# Patient Record
Sex: Male | Born: 1965 | Race: White | Hispanic: No | Smoking: Former smoker
Health system: Southern US, Community
[De-identification: ages and names within clinical notes are randomized; demographics above are authoritative.]

## PROBLEM LIST (undated history)

## (undated) DIAGNOSIS — F329 Major depressive disorder, single episode, unspecified: Secondary | ICD-10-CM

## (undated) DIAGNOSIS — J189 Pneumonia, unspecified organism: Secondary | ICD-10-CM

## (undated) DIAGNOSIS — F32A Depression, unspecified: Secondary | ICD-10-CM

## (undated) DIAGNOSIS — B2799 Infectious mononucleosis, unspecified with other complication: Secondary | ICD-10-CM

## (undated) DIAGNOSIS — K759 Inflammatory liver disease, unspecified: Secondary | ICD-10-CM

## (undated) DIAGNOSIS — B178 Other specified acute viral hepatitis: Secondary | ICD-10-CM

## (undated) DIAGNOSIS — M109 Gout, unspecified: Secondary | ICD-10-CM

## (undated) HISTORY — PX: TONSILLECTOMY: SUR1361

## (undated) HISTORY — DX: Depression, unspecified: F32.A

## (undated) HISTORY — DX: Major depressive disorder, single episode, unspecified: F32.9

---

## 2005-06-21 ENCOUNTER — Emergency Department (HOSPITAL_COMMUNITY): Admission: EM | Admit: 2005-06-21 | Discharge: 2005-06-21 | Payer: Self-pay | Admitting: Family Medicine

## 2009-07-23 ENCOUNTER — Encounter: Admission: RE | Admit: 2009-07-23 | Discharge: 2009-07-23 | Payer: Self-pay | Admitting: Gastroenterology

## 2010-07-23 ENCOUNTER — Emergency Department (HOSPITAL_COMMUNITY)
Admission: EM | Admit: 2010-07-23 | Discharge: 2010-07-23 | Disposition: A | Discharge status: Home / Self Care | Payer: Commercial Managed Care - PPO | Attending: Emergency Medicine | Admitting: Emergency Medicine

## 2010-07-23 ENCOUNTER — Emergency Department (HOSPITAL_COMMUNITY): Payer: Commercial Managed Care - PPO

## 2010-07-23 DIAGNOSIS — R0989 Other specified symptoms and signs involving the circulatory and respiratory systems: Secondary | ICD-10-CM | POA: Insufficient documentation

## 2010-07-23 DIAGNOSIS — R0602 Shortness of breath: Secondary | ICD-10-CM | POA: Insufficient documentation

## 2010-07-23 DIAGNOSIS — R079 Chest pain, unspecified: Secondary | ICD-10-CM | POA: Insufficient documentation

## 2010-07-23 DIAGNOSIS — IMO0002 Reserved for concepts with insufficient information to code with codable children: Secondary | ICD-10-CM | POA: Insufficient documentation

## 2010-07-23 DIAGNOSIS — F411 Generalized anxiety disorder: Secondary | ICD-10-CM | POA: Insufficient documentation

## 2010-07-23 DIAGNOSIS — Y9383 Activity, rough housing and horseplay: Secondary | ICD-10-CM | POA: Insufficient documentation

## 2010-07-23 DIAGNOSIS — R0609 Other forms of dyspnea: Secondary | ICD-10-CM | POA: Insufficient documentation

## 2010-07-23 LAB — DIFFERENTIAL
Basophils Relative: 1 % (ref 0–1)
Eosinophils Relative: 1 % (ref 0–5)
Lymphocytes Relative: 21 % (ref 12–46)
Lymphs Abs: 2.6 10*3/uL (ref 0.7–4.0)
Monocytes Absolute: 0.8 10*3/uL (ref 0.1–1.0)
Neutro Abs: 8.6 10*3/uL — ABNORMAL HIGH (ref 1.7–7.7)
Neutrophils Relative %: 71 % (ref 43–77)

## 2010-07-23 LAB — BASIC METABOLIC PANEL
Creatinine, Ser: 1 mg/dL (ref 0.4–1.5)
GFR calc non Af Amer: >60 mL/min (ref >60)
Potassium: 3.9 mEq/L (ref 3.5–5.1)
Sodium: 138 mEq/L (ref 135–145)

## 2010-07-23 LAB — CBC
Hemoglobin: 16.7 g/dL (ref 13.0–17.0)
MCH: 31.7 pg (ref 26.0–34.0)
MCHC: 35.5 g/dL (ref 30.0–36.0)
MCV: 89.2 fL (ref 78.0–100.0)
RBC: 5.27 MIL/uL (ref 4.22–5.81)

## 2010-07-23 LAB — CK TOTAL AND CKMB (NOT AT ARMC)
CK, MB: 2.1 ng/mL (ref 0.3–4.0)
Relative Index: 1.7 (ref 0.0–2.5)
Total CK: 121 U/L (ref 7–232)

## 2012-05-30 ENCOUNTER — Ambulatory Visit: Payer: Self-pay | Admitting: Emergency Medicine

## 2012-05-30 VITALS — BP 126/78 | HR 108 | Temp 98.8°F | Resp 16 | Ht 66.5 in | Wt 208.0 lb

## 2012-05-30 DIAGNOSIS — J209 Acute bronchitis, unspecified: Secondary | ICD-10-CM

## 2012-05-30 DIAGNOSIS — J309 Allergic rhinitis, unspecified: Secondary | ICD-10-CM

## 2012-05-30 MED ORDER — HYDROCOD POLST-CHLORPHEN POLST 10-8 MG/5ML PO LQCR: 5.0000 mL | Freq: Two times a day (BID) | ORAL | Status: DC | PRN

## 2012-05-30 MED ORDER — ONDANSETRON 8 MG PO TBDP: 8.0000 mg | ORAL_TABLET | Freq: Three times a day (TID) | ORAL | Status: DC | PRN

## 2012-05-30 MED ORDER — AZITHROMYCIN 250 MG PO TABS: ORAL_TABLET | ORAL | Status: DC

## 2012-05-30 MED ORDER — PSEUDOEPHEDRINE-GUAIFENESIN ER 60-600 MG PO TB12: 1.0000 | ORAL_TABLET | Freq: Two times a day (BID) | ORAL | Status: DC

## 2012-05-30 NOTE — Patient Instructions (Addendum)
Allergic Rhinitis  Allergic rhinitis is when the mucous membranes in the nose respond to allergens. Allergens are particles in the air that cause your body to have an allergic reaction. This causes you to release allergic antibodies. Through a chain of events, these eventually cause you to release histamine into the blood stream (hence the use of antihistamines). Although meant to be protective to the body, it is this release that causes your discomfort, such as frequent sneezing, congestion and an itchy runny nose.    CAUSES    The pollen allergens may come from grasses, trees, and weeds. This is seasonal allergic rhinitis, or "hay fever." Other allergens cause year-round allergic rhinitis (perennial allergic rhinitis) such as house dust mite allergen, pet dander and mold spores.    SYMPTOMS     Nasal stuffiness (congestion).   Runny, itchy nose with sneezing and tearing of the eyes.   There is often an itching of the mouth, eyes and ears.  It cannot be cured, but it can be controlled with medications.  DIAGNOSIS    If you are unable to determine the offending allergen, skin or blood testing may find it.  TREATMENT     Avoid the allergen.   Medications and allergy shots (immunotherapy) can help.   Hay fever may often be treated with antihistamines in pill or nasal spray forms. Antihistamines block the effects of histamine. There are over-the-counter medicines that may help with nasal congestion and swelling around the eyes. Check with your caregiver before taking or giving this medicine.  If the treatment above does not work, there are many new medications your caregiver can prescribe. Stronger medications may be used if initial measures are ineffective. Desensitizing injections can be used if medications and avoidance fails. Desensitization is when a patient is given ongoing shots until the body becomes less sensitive to the allergen. Make sure you follow up with your caregiver if problems continue.   SEEK MEDICAL CARE IF:     You develop fever (more than 100.5 F (38.1 C).   You develop a cough that does not stop easily (persistent).   You have shortness of breath.   You start wheezing.   Symptoms interfere with normal daily activities.  Document Released: 10/21/2000 Document Revised: 04/20/2011 Document Reviewed: 05/02/2008  Exit[employer] Patient Information 2013 [employer].

## 2012-05-30 NOTE — Progress Notes (Signed)
[facility] [address] [place] [address] [phone]  Date:  05/30/2012   Name:  Jesse Warner   DOB:  04-12-1965   MRN:  [id number]  PCP:  No primary provider on file.    Chief Complaint: Cough and Nasal Congestion   History of Present Illness:  Jesse Warner is a 47 y.o. very pleasant male patient who presents with the following:  Patient has no usual history of allergies.  Now has nasal congestion, discharge that is largely mucoid.  No fever or chills.  Has a cough productive of AM scant purulent sputum.  Denies wheezing or shortness of breath. Fatigued and some malaise  No stool change or rash.  Over past few days has post tussive nausea and one episode of vomiting.  No underlying nausea.  No improvement with over the counter medications or other home remedies. Denies other complaint or health concern today.   There is no problem list on file for this patient.   No past medical history on file.  No past surgical history on file.  History  Substance Use Topics  . 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 Smokeless tobacco: Not on file  . Alcohol Use: Yes    No family history on file.  No Known Allergies  Medication list has been reviewed and updated.  No current outpatient prescriptions on file prior to visit.   No current facility-administered medications on file prior to visit.    Review of Systems:  As per HPI, otherwise negative.    Physical Examination: Filed Vitals:   05/30/12 1431  BP: 126/78  Pulse: 108  Temp: 98.8 F (37.1 C)  Resp: 16   Filed Vitals:   05/30/12 1431  Height: 5' 6.5" (1.689 m)  Weight: 208 lb (94.348 kg)   Body mass index is 33.07 kg/(m^2). Ideal Body Weight: Weight in (lb) to have BMI = 25: 156.9  GEN: WDWN, NAD, Non-toxic, A & O x 3 HEENT: obese, Normocephalic. Neck supple. No masses, No LAD. Ears and Nose: No external deformity. CV: RRR, No M/G/R. No JVD. No thrill. No extra heart sounds. PULM: CTA  B, no wheezes, crackles, rhonchi. No retractions. No resp. distress. No accessory muscle use. ABD: S, NT, ND, +BS. No rebound. No HSM. EXTR: No c/c/e NEURO Normal gait.  PSYCH: Normally interactive. Conversant. Not depressed or anxious appearing.  Calm demeanor.    Assessment and Plan: Seasonal allergic rhinitis Bronchitis zpak mucinex d tussionex zofran   Signed,  Phillips Odor, MD

## 2012-05-30 NOTE — Addendum Note (Signed)
Addended by: Carmelina Dane on: 05/30/2012 03:27 PM   Modules accepted: Orders

## 2012-06-05 ENCOUNTER — Encounter (HOSPITAL_COMMUNITY): Payer: Self-pay | Admitting: *Deleted

## 2012-06-05 ENCOUNTER — Emergency Department (HOSPITAL_COMMUNITY)
Admission: EM | Admit: 2012-06-05 | Discharge: 2012-06-05 | Disposition: A | Discharge status: Home / Self Care | Payer: Self-pay | Source: Home / Self Care | Attending: Emergency Medicine | Admitting: Emergency Medicine

## 2012-06-05 ENCOUNTER — Emergency Department (INDEPENDENT_AMBULATORY_CARE_PROVIDER_SITE_OTHER): Payer: Commercial Managed Care - PPO

## 2012-06-05 ENCOUNTER — Emergency Department (INDEPENDENT_AMBULATORY_CARE_PROVIDER_SITE_OTHER): Payer: Self-pay

## 2012-06-05 DIAGNOSIS — M109 Gout, unspecified: Secondary | ICD-10-CM

## 2012-06-05 DIAGNOSIS — J189 Pneumonia, unspecified organism: Secondary | ICD-10-CM

## 2012-06-05 LAB — URIC ACID: Uric Acid, Serum: 8 mg/dL — ABNORMAL HIGH (ref 4.0–7.8)

## 2012-06-05 MED ORDER — IBUPROFEN 800 MG PO TABS
ORAL_TABLET | ORAL | Status: AC
  Filled 2012-06-05: qty 1

## 2012-06-05 MED ORDER — LEVOFLOXACIN 500 MG PO TABS: 500.0000 mg | ORAL_TABLET | Freq: Every day | ORAL | Status: DC

## 2012-06-05 MED ORDER — LIDOCAINE HCL (PF) 1 % IJ SOLN
INTRAMUSCULAR | Status: AC
  Filled 2012-06-05: qty 5

## 2012-06-05 MED ORDER — CEFTRIAXONE SODIUM 1 G IJ SOLR
1.0000 g | Freq: Once | INTRAMUSCULAR | Status: AC
  Administered 2012-06-05: 1 g via INTRAMUSCULAR

## 2012-06-05 MED ORDER — CEFTRIAXONE SODIUM 1 G IJ SOLR
INTRAMUSCULAR | Status: AC
  Filled 2012-06-05: qty 10

## 2012-06-05 MED ORDER — COLCHICINE 0.6 MG PO TABS: ORAL_TABLET | ORAL | Status: DC

## 2012-06-05 MED ORDER — HYDROCOD POLST-CHLORPHEN POLST 10-8 MG/5ML PO LQCR: 5.0000 mL | Freq: Two times a day (BID) | ORAL | Status: DC | PRN

## 2012-06-05 MED ORDER — AMOXICILLIN-POT CLAVULANATE 875-125 MG PO TABS: 1.0000 | ORAL_TABLET | Freq: Two times a day (BID) | ORAL | Status: DC

## 2012-06-05 MED ORDER — IBUPROFEN 800 MG PO TABS
800.0000 mg | ORAL_TABLET | Freq: Once | ORAL | Status: AC
  Administered 2012-06-05: 800 mg via ORAL

## 2012-06-05 NOTE — ED Provider Notes (Signed)
Chief Complaint:   Chief Complaint  Patient presents with  . Ankle Pain    History of Present Illness:   Jesse Warner is a 47 year old male who comes in today with right ankle pain and cough.  1. Right ankle pain: This is been going on for 5 days. There is pain, swelling, erythema, and heat. It hurts to touch her to move. He denies any injury to the area. He's had no fever or chills. No prior history of arthritis, gout, or any other joint pains.  2. Cough: For the past 2 weeks she's had a dry cough and wheezing. He doesn't have much energy. He had some fever at the onset of the symptoms but none recently. He denies any sore throat but does have some postnasal drip. He saw another physician at [facility] urgent care this past Monday, week ago was given a Z-Pak, cough medicine, and nausea medication, and allergy medication. He does not feel any better. He denies any chest pain or shortness of breath.  Review of Systems:  Other than noted above, the patient denies any of the following symptoms: Systemic:  No fevers, chills, sweats, weight loss or gain, fatigue, or tiredness. Eye:  No redness or discharge. ENT:  No ear pain, drainage, headache, nasal congestion, drainage, sinus pressure, difficulty swallowing, or sore throat. Neck:  No neck pain or swollen glands. Lungs:  No cough, sputum production, hemoptysis, wheezing, chest tightness, shortness of breath or chest pain. GI:  No abdominal pain, nausea, vomiting or diarrhea.  PMFSH:  Past medical history, family history, social history, meds, and allergies were reviewed.  Physical Exam:   Vital signs:  BP 127/80  Pulse 97  Temp(Src) 97.4 F (36.3 C) (Oral)  Resp 18  SpO2 97% General:  Alert and oriented.  In no distress.  Skin warm and dry. Eye:  No conjunctival injection or drainage. Lids were normal. ENT:  TMs and canals were normal, without erythema or inflammation.  Nasal mucosa was clear and uncongested, without drainage.  Mucous  membranes were moist.  Pharynx was clear with no exudate or drainage.  There were no oral ulcerations or lesions. Neck:  Supple, no adenopathy, tenderness or mass. Lungs:  No respiratory distress.  Lungs were clear to auscultation, without wheezes, rales or rhonchi.  Breath sounds were clear and equal bilaterally.  Heart:  Regular rhythm, without gallops, murmers or rubs. Extremities: The right ankle was diffusely erythematous, swollen, tender, and hot to touch. There was pain with movement. The erythema extends onto the dorsum of the foot. Joint survey was otherwise unremarkable, pulses were full, sensation was intact, and capillary refill was normal. Skin:  Clear, warm, and dry, without rash or lesions.  Labs:   Results for orders placed during the hospital encounter of 06/05/12  URIC ACID      Result Value Range   Uric Acid, Serum 8.0 (*) 4.0 - 7.8 mg/dL     Radiology:  Dg Chest 2 View  06/05/2012  *RADIOLOGY REPORT*  Clinical Data: Cough for the past 2 weeks.  CHEST - 2 VIEW  Comparison: Chest x-ray 07/23/2010.  Findings: New area of airspace consolidation in the right upper lobe, concerning for pneumonia.  The lungs are otherwise clear.  No definite pleural effusions.  Pulmonary vasculature is normal.  Mild right hilar fullness.  Mediastinal contours are otherwise unremarkable.  IMPRESSION: 1.  New area of airspace consolidation in the right upper lobe highly concerning for pneumonia.  There is slight right hilar prominence,  which likely reflects some reactive hilar lymphadenopathy.  Follow-up chest radiographs in 2-3 weeks after appropriate trial of antimicrobial therapy are recommended to ensure complete resolution of these findings (i.e., to exclude the presence of a central obstructing lesion).   Original Report Authenticated By: Trudie Reed, M.D.    Dg Ankle Complete Right  06/05/2012  *RADIOLOGY REPORT*  Clinical Data: 5-day history of right-sided ankle pain.  RIGHT ANKLE - COMPLETE  3+ VIEW  Comparison: No priors.  Findings: Three views of the right ankle demonstrate no acute displaced fracture, subluxation, dislocation, joint or soft tissue abnormality.  IMPRESSION: 1.  No acute radiographic abnormality of the right ankle.   Original Report Authenticated By: Trudie Reed, M.D.     Course in Urgent Care Center:   For the pneumonia he was given Rocephin 1 g IM.  Assessment:  The primary encounter diagnosis was Gout. A diagnosis of Community acquired pneumonia was also pertinent to this visit.  He appears to have both a right midlung infiltrate compatible with community-acquired pneumonia and acute gout. For the gout I am going to treat with colchicine. I suggest he followup with a primary care physician in about 2 weeks with respect to the elevated uric acid. He will probably need a uric acid lowering drugs. For the pneumonia, he's to followup here in 24 hours, and was begun on the meds as listed below. I'm going to use antibiotics, since it does seem to have been resistant to Z-Pak and may be a relatively resistant bacteria. I impressed upon him the importance of following up in 2-3 weeks with his primary care physician with a repeat chest x-ray to make sure this is completely cleared up.  Plan:   1.  The following meds were prescribed:   Discharge Medication List as of 06/05/2012  4:49 PM    START taking these medications   Details  amoxicillin-clavulanate (AUGMENTIN) 875-125 MG per tablet Take 1 tablet by mouth 2 (two) times daily., Starting 06/05/2012, Until Discontinued, Normal    !! chlorpheniramine-HYDROcodone (TUSSIONEX) 10-8 MG/5ML LQCR Take 5 mLs by mouth every 12 (twelve) hours as needed., Starting 06/05/2012, Until Discontinued, Normal    colchicine 0.6 MG tablet Take 2 now and 1 in 1 hour.  May repeat dose once daily.  For gout attack., Normal    levofloxacin (LEVAQUIN) 500 MG tablet Take 1 tablet (500 mg total) by mouth daily., Starting 06/05/2012, Until  Discontinued, Normal     !! - Potential duplicate medications found. Please discuss with provider.     2.  The patient was instructed in symptomatic care and handouts were given. 3.  The patient was told to return if becoming worse in any way, for a scheduled followup in 48 hours, and given some red flag symptoms such as fever, chest pain, or difficulty breathing that would indicate earlier return.      Reuben Likes, MD 06/05/12 [phone]

## 2012-06-05 NOTE — ED Notes (Signed)
Patient complains of right ankle pain that started Wednesday. States does know what happened; states did not injure his ankle at all.

## 2012-06-07 ENCOUNTER — Encounter (HOSPITAL_COMMUNITY): Payer: Self-pay | Admitting: Emergency Medicine

## 2012-06-07 ENCOUNTER — Emergency Department (INDEPENDENT_AMBULATORY_CARE_PROVIDER_SITE_OTHER)
Admission: EM | Admit: 2012-06-07 | Discharge: 2012-06-07 | Disposition: A | Discharge status: Home / Self Care | Payer: Self-pay | Source: Home / Self Care | Attending: Family Medicine | Admitting: Family Medicine

## 2012-06-07 DIAGNOSIS — J189 Pneumonia, unspecified organism: Secondary | ICD-10-CM

## 2012-06-07 NOTE — ED Notes (Signed)
Pt presents for follow up on pneumonia from 2 days ago. No new problems. Cough seems better. Patient is alert and oriented.

## 2012-06-07 NOTE — ED Provider Notes (Signed)
History     CSN: [id number]  Arrival date & time 06/07/12  1338   First MD Initiated Contact with Patient 06/07/12 1501      Chief Complaint  Patient presents with  . Follow-up    (Consider location/radiation/quality/duration/timing/severity/associated sxs/prior treatment) Patient is a 47 y.o. male presenting with cough. The history is provided by the patient. No language interpreter was used.  Cough Cough characteristics:  Productive Sputum characteristics:  Nondescript Severity:  Moderate  Pt here for recheck of Pneumonia.   Pt was seen here Thursday and is herre today for a 2 day recheck.  Pt is feeling better,  Decreased temp,  Pt reports still has bad cough History reviewed. No pertinent past medical history.  History reviewed. No pertinent past surgical history.  History reviewed. No pertinent family history.  History  Substance Use Topics  . 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 Smokeless tobacco: Not on file  . Alcohol Use: Yes     Comment: occasionally      Review of Systems  Respiratory: Positive for cough.   All other systems reviewed and are negative.    Allergies  Review of patient's allergies indicates no known allergies.  Home Medications   Current Outpatient Rx  Name  Route  Sig  Dispense  Refill  . amoxicillin-clavulanate (AUGMENTIN) 875-125 MG per tablet   Oral   Take 1 tablet by mouth 2 (two) times daily.   20 tablet   0   . azithromycin (ZITHROMAX) 250 MG tablet      Take 2 tabs PO x 1 dose, then 1 tab PO QD x 4 days   6 tablet   0   . chlorpheniramine-HYDROcodone (TUSSIONEX PENNKINETIC ER) 10-8 MG/5ML LQCR   Oral   Take 5 mLs by mouth every 12 (twelve) hours as needed.   60 mL   0   . chlorpheniramine-HYDROcodone (TUSSIONEX) 10-8 MG/5ML LQCR   Oral   Take 5 mLs by mouth every 12 (twelve) hours as needed.   140 mL   0   . colchicine 0.6 MG tablet      Take 2 now and 1 in 1 hour.  May repeat dose once daily.  For gout  attack.   12 tablet   0   . levofloxacin (LEVAQUIN) 500 MG tablet   Oral   Take 1 tablet (500 mg total) by mouth daily.   7 tablet   0   . ondansetron (ZOFRAN-ODT) 8 MG disintegrating tablet   Oral   Take 1 tablet (8 mg total) by mouth every 8 (eight) hours as needed for nausea.   30 tablet   0   . pseudoephedrine-guaifenesin (MUCINEX D) 60-600 MG per tablet   Oral   Take 1 tablet by mouth every 12 (twelve) hours.   18 tablet   0     BP 108/72  Pulse 92  Temp(Src) 97.9 F (36.6 C) (Oral)  Resp 16  SpO2 98%  Physical Exam  Constitutional: He appears well-developed and well-nourished.  HENT:  Head: Normocephalic.  Eyes: Conjunctivae and EOM are normal. Pupils are equal, round, and reactive to light.  Neck: Normal range of motion.  Cardiovascular: Normal rate.   Pulmonary/Chest: Effort normal and breath sounds normal.  Abdominal: Soft. Bowel sounds are normal.  Musculoskeletal: Normal range of motion.  Neurological: He is alert.  Skin: Skin is warm.  Psychiatric: He has a normal mood and affect.    ED Course  Procedures (including critical care time)  Labs Reviewed - No data to display Dg Chest 2 View  06/05/2012  *RADIOLOGY REPORT*  Clinical Data: Cough for the past 2 weeks.  CHEST - 2 VIEW  Comparison: Chest x-ray 07/23/2010.  Findings: New area of airspace consolidation in the right upper lobe, concerning for pneumonia.  The lungs are otherwise clear.  No definite pleural effusions.  Pulmonary vasculature is normal.  Mild right hilar fullness.  Mediastinal contours are otherwise unremarkable.  IMPRESSION: 1.  New area of airspace consolidation in the right upper lobe highly concerning for pneumonia.  There is slight right hilar prominence, which likely reflects some reactive hilar lymphadenopathy.  Follow-up chest radiographs in 2-3 weeks after appropriate trial of antimicrobial therapy are recommended to ensure complete resolution of these findings (i.e., to  exclude the presence of a central obstructing lesion).   Original Report Authenticated By: Trudie Reed, M.D.    Dg Ankle Complete Right  06/05/2012  *RADIOLOGY REPORT*  Clinical Data: 5-day history of right-sided ankle pain.  RIGHT ANKLE - COMPLETE 3+ VIEW  Comparison: No priors.  Findings: Three views of the right ankle demonstrate no acute displaced fracture, subluxation, dislocation, joint or soft tissue abnormality.  IMPRESSION: 1.  No acute radiographic abnormality of the right ankle.   Original Report Authenticated By: Trudie Reed, M.D.      1. Community acquired pneumonia       MDM  Pt advised to return for repeat chest xray in 3 weeks        Elson Areas, PA-C 06/07/12 1527  Lonia Skinner [place], [place] 06/07/12 1527

## 2012-06-17 NOTE — ED Provider Notes (Signed)
Medical screening examination/treatment/procedure(s) were performed by resident physician or non-physician practitioner and as supervising physician I was immediately available for consultation/collaboration.   Barkley Bruns MD.   Linna Hoff, MD 06/17/12 1014

## 2012-06-24 ENCOUNTER — Emergency Department (HOSPITAL_COMMUNITY)
Admission: EM | Admit: 2012-06-24 | Discharge: 2012-06-24 | Disposition: A | Discharge status: Home / Self Care | Payer: Commercial Managed Care - PPO | Source: Home / Self Care

## 2012-06-24 ENCOUNTER — Encounter (HOSPITAL_COMMUNITY): Payer: Self-pay | Admitting: Emergency Medicine

## 2012-06-24 ENCOUNTER — Emergency Department (INDEPENDENT_AMBULATORY_CARE_PROVIDER_SITE_OTHER): Payer: PRIVATE HEALTH INSURANCE

## 2012-06-24 DIAGNOSIS — M25579 Pain in unspecified ankle and joints of unspecified foot: Secondary | ICD-10-CM

## 2012-06-24 DIAGNOSIS — M25571 Pain in right ankle and joints of right foot: Secondary | ICD-10-CM

## 2012-06-24 DIAGNOSIS — M109 Gout, unspecified: Secondary | ICD-10-CM

## 2012-06-24 DIAGNOSIS — Z8701 Personal history of pneumonia (recurrent): Secondary | ICD-10-CM

## 2012-06-24 HISTORY — DX: Infectious mononucleosis, unspecified with other complication: B27.99

## 2012-06-24 HISTORY — DX: Inflammatory liver disease, unspecified: K75.9

## 2012-06-24 HISTORY — DX: Other specified acute viral hepatitis: B17.8

## 2012-06-24 HISTORY — DX: Gout, unspecified: M10.9

## 2012-06-24 HISTORY — DX: Pneumonia, unspecified organism: J18.9

## 2012-06-24 MED ORDER — COLCHICINE 0.6 MG PO TABS: ORAL_TABLET | ORAL | Status: DC

## 2012-06-24 MED ORDER — INDOMETHACIN 50 MG PO CAPS: 50.0000 mg | ORAL_CAPSULE | Freq: Two times a day (BID) | ORAL | Status: DC

## 2012-06-24 NOTE — ED Notes (Signed)
Multiple complaints: reports he was told to return for follow up to pneumonia: patient says he feels "fine". Patient reports he was also treated for gout.  Received four pills and is no better.  While taking pills, felt a little better, but since then pain is increasing.  Pain is ankle and foot, primarily ankle.  Patient reports pain, swelling and redness.

## 2012-06-24 NOTE — ED Notes (Signed)
Pneumonia dx on 4/27

## 2012-06-24 NOTE — ED Notes (Signed)
Notified david, pa in regards to patient reflux requests

## 2012-06-24 NOTE — ED Notes (Signed)
Patient requests assistance with reflux, requests medication.

## 2012-06-24 NOTE — ED Provider Notes (Signed)
Medical screening examination/treatment/procedure(s) were performed by resident physician or non-physician practitioner and as supervising physician I was immediately available for consultation/collaboration.   Barkley Bruns MD.   Linna Hoff, MD 06/24/12 1420

## 2012-06-24 NOTE — ED Provider Notes (Signed)
History     CSN: [id number]  Arrival date & time 06/24/12  1014   First MD Initiated Contact with Patient 06/24/12 1052      Chief Complaint  Patient presents with  . Gout  . Pneumonia    (Consider location/radiation/quality/duration/timing/severity/associated sxs/prior treatment) HPI Comments: Approximately 3 weeks ago the patient the scene in our urgent care in diagnosed with bronchitis. He was treated with azithromycin and was asked to return in 2 days for recheck. When he returned he was feeling worse having fever, cough and trouble breathing. The x-ray demonstrated consolidation representing pneumonia. He was then placed on Levaquin and asked to come back if worse and in 2-3 weeks for a repeat chest x-ray. He was also seen for ankle pain which have been diagnosed as gout. Uric acid level is 8.0. Upper limits of normal 7.8. He was given for colchicine tablets 1 per day. He states that did help the pain for a short time but did not abate and the pain is persistent. He states he feels much better now he has no shortness of breath, cough, fever, fatigue or malaise. He does have mild right ankle pain without redness or appreciable swelling to me.   Past Medical History  Diagnosis Date  . Gout   . Pneumonia   . Hepatitis   . Mononucleosis, infectious, with hepatitis     Past Surgical History  Procedure Laterality Date  . Tonsillectomy      No family history on file.  History  Substance Use Topics  . 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 Smokeless tobacco: Not on file  . Alcohol Use: Yes     Comment: occasionally      Review of Systems  Constitutional: Negative.   HENT: Negative.   Respiratory: Negative.   Cardiovascular: Negative.   Gastrointestinal: Negative.   Musculoskeletal: Positive for joint swelling and arthralgias.        as per history of present illness.  Skin: Negative.   Psychiatric/Behavioral: Negative.     Allergies  Review of patient's allergies  indicates no known allergies.  Home Medications   Current Outpatient Rx  Name  Route  Sig  Dispense  Refill  . amoxicillin-clavulanate (AUGMENTIN) 875-125 MG per tablet   Oral   Take 1 tablet by mouth 2 (two) times daily.   20 tablet   0   . azithromycin (ZITHROMAX) 250 MG tablet      Take 2 tabs PO x 1 dose, then 1 tab PO QD x 4 days   6 tablet   0   . chlorpheniramine-HYDROcodone (TUSSIONEX PENNKINETIC ER) 10-8 MG/5ML LQCR   Oral   Take 5 mLs by mouth every 12 (twelve) hours as needed.   60 mL   0   . chlorpheniramine-HYDROcodone (TUSSIONEX) 10-8 MG/5ML LQCR   Oral   Take 5 mLs by mouth every 12 (twelve) hours as needed.   140 mL   0   . colchicine 0.6 MG tablet      Take 2 now and 1 in 1 hour.  May repeat dose once daily.  For gout attack.   12 tablet   0   . colchicine 0.6 MG tablet      2 tabs po today, then 1 q d for 6 days. Take with food.   8 tablet   0   . indomethacin (INDOCIN) 50 MG capsule   Oral   Take 1 capsule (50 mg total) by mouth 2 (two) times daily with  a meal.   20 capsule   0   . levofloxacin (LEVAQUIN) 500 MG tablet   Oral   Take 1 tablet (500 mg total) by mouth daily.   7 tablet   0   . ondansetron (ZOFRAN-ODT) 8 MG disintegrating tablet   Oral   Take 1 tablet (8 mg total) by mouth every 8 (eight) hours as needed for nausea.   30 tablet   0   . pseudoephedrine-guaifenesin (MUCINEX D) 60-600 MG per tablet   Oral   Take 1 tablet by mouth every 12 (twelve) hours.   18 tablet   0     BP 121/77  Pulse 84  Temp(Src) 98.6 F (37 C) (Oral)  Resp 18  SpO2 98%  Physical Exam  Nursing note and vitals reviewed. Constitutional: He is oriented to person, place, and time. He appears well-developed and well-nourished. No distress.  Eyes: Conjunctivae and EOM are normal.  Neck: Normal range of motion. Neck supple.  Cardiovascular: Normal rate, regular rhythm and normal heart sounds.   No murmur heard. Pulmonary/Chest: Effort  normal and breath sounds normal. No respiratory distress. He has no wheezes. He has no rales.  Musculoskeletal: Normal range of motion.  Mild tenderness to the right ankle. I do not see the amount of swelling that the patient is describing. There is no discoloration, redness or other observed signs of injury or pathology.  Lymphadenopathy:    He has no cervical adenopathy.  Neurological: He is alert and oriented to person, place, and time. He exhibits normal muscle tone.  Skin: Skin is warm and dry. No rash noted. No erythema.  Psychiatric: He has a normal mood and affect.    ED Course  Procedures (including critical care time)  Labs Reviewed - No data to display Dg Chest 2 View  06/24/2012   *RADIOLOGY REPORT*  Clinical Data: Gout, pneumonia  CHEST - 2 VIEW  Comparison: 06/05/2012  Findings: Right upper lobe infiltrate is clear.  Lungs currently show no evidence of pneumonia.  No mass or adenopathy.  No pleural effusion.  IMPRESSION: No active cardiopulmonary disease.  Interval clearing of right upper lobe pneumonia.   Original Report Authenticated By: Janeece Riggers, M.D.     1. Ankle pain, right   2. Gout   3. History of pneumonia       MDM  The chest x-ray is reported to be clear and free of lymphadenopathy. No signs of pneumonia. For the patient's gout we will give one more course of colchicine 0.6 mg 2 today then one every day for 6 days. It is recommended that he obtain a primary care doctor for followup in health maintenance care. For new symptoms problems or worsening may return.  Hayden Rasmussen, NP 06/24/12 1214

## 2012-06-24 NOTE — ED Notes (Signed)
Patient in gown 

## 2012-12-06 ENCOUNTER — Telehealth: Payer: Self-pay | Admitting: Pulmonary Disease

## 2012-12-06 ENCOUNTER — Emergency Department (INDEPENDENT_AMBULATORY_CARE_PROVIDER_SITE_OTHER): Payer: Self-pay

## 2012-12-06 ENCOUNTER — Encounter (HOSPITAL_COMMUNITY): Payer: Self-pay | Admitting: Emergency Medicine

## 2012-12-06 ENCOUNTER — Emergency Department (HOSPITAL_COMMUNITY)
Admission: EM | Admit: 2012-12-06 | Discharge: 2012-12-06 | Disposition: A | Discharge status: Home / Self Care | Payer: PRIVATE HEALTH INSURANCE | Source: Home / Self Care | Attending: Family Medicine | Admitting: Family Medicine

## 2012-12-06 DIAGNOSIS — J180 Bronchopneumonia, unspecified organism: Secondary | ICD-10-CM

## 2012-12-06 MED ORDER — LIDOCAINE HCL (PF) 1 % IJ SOLN
INTRAMUSCULAR | Status: AC
  Filled 2012-12-06: qty 5

## 2012-12-06 MED ORDER — CEFTRIAXONE SODIUM 1 G IJ SOLR
INTRAMUSCULAR | Status: AC
  Filled 2012-12-06: qty 10

## 2012-12-06 MED ORDER — CEFTRIAXONE SODIUM 1 G IJ SOLR
1.0000 g | Freq: Once | INTRAMUSCULAR | Status: AC
  Administered 2012-12-06: 1 g via INTRAMUSCULAR

## 2012-12-06 MED ORDER — CLARITHROMYCIN ER 500 MG PO TB24: 1000.0000 mg | ORAL_TABLET | Freq: Every day | ORAL | Status: DC

## 2012-12-06 NOTE — ED Notes (Signed)
Pt given injection will discharge at 12:45 p.m

## 2012-12-06 NOTE — Telephone Encounter (Signed)
Received call from ER provider.  Pt has recurrent pneumonia and needs pulmonary consultation as outpt.   Will have Triage call pt to schedule next available pulmonary consult appointment >> can by with any provider, and does not need to be with me if no appointment available.  Dg Chest 2 View  12/06/2012   CLINICAL DATA:  Cough and fever  EXAM: CHEST  2 VIEW  COMPARISON:  DG CHEST 2 VIEW dated 06/24/2012  FINDINGS: Normal cardiac silhouette. There is the segmental airspace disease in the lateral right middle lobe and inferior right upper lobe. No pneumothorax.  IMPRESSION: Right middle lobe and upper lobe bronchopneumonia.   Electronically Signed   By: Genevive Bi M.D.   On: 12/06/2012 12:03

## 2012-12-06 NOTE — ED Notes (Signed)
C/o productive cough with green sputum. Congestion. Hx of pneumonia. Denies chest pain and sob. No fever, n/v/d. No relief with otc meds.

## 2012-12-06 NOTE — Telephone Encounter (Signed)
I spoke with pt and is scheduled to come in and see MW tomorrow at 2 PM. Pt aware of location. Nothing further needed

## 2012-12-06 NOTE — ED Provider Notes (Signed)
CSN: [id number]     Arrival date & time 12/06/12  4098 History   First MD Initiated Contact with Patient 12/06/12 1020     Chief Complaint  Patient presents with  . Cough    cough and congestion since friday.    (Consider location/radiation/quality/duration/timing/severity/associated sxs/prior Treatment) HPI Comments: 47 year old male presents complaining of cough productive of green sputum and fatigue. He has a recent history of pneumonia. He believes he really only has a cold right now but it is not went to get sick again with pneumonia so he came in to get checked earlier. He was feeling sicker than he is now feeling slightly better at this time he denies fever, chills, chest pain, shortness of breath. Denies leg swelling, recent travel, sick contacts. Denies history of DVT or PE  Patient is a 47 y.o. male presenting with cough.  Cough Associated symptoms: no chest pain, no chills, no fever, no myalgias, no rash, no shortness of breath and no sore throat     Past Medical History  Diagnosis Date  . Gout   . Pneumonia   . Hepatitis   . Mononucleosis, infectious, with hepatitis    Past Surgical History  Procedure Laterality Date  . Tonsillectomy     History reviewed. No pertinent family history. History  Substance Use Topics  . 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 Smokeless tobacco: Not on file  . Alcohol Use: Yes     Comment: occasionally    Review of Systems  Constitutional: Positive for fatigue. Negative for fever and chills.  HENT: Negative for sore throat.   Eyes: Negative for visual disturbance.  Respiratory: Positive for cough. Negative for shortness of breath.   Cardiovascular: Negative for chest pain, palpitations and leg swelling.  Gastrointestinal: Negative for nausea, vomiting, abdominal pain, diarrhea and constipation.  Genitourinary: Negative for dysuria, urgency, frequency and hematuria.  Musculoskeletal: Negative for arthralgias, myalgias, neck pain and neck  stiffness.  Skin: Negative for rash.  Neurological: Negative for dizziness, weakness and light-headedness.    Allergies  Review of patient's allergies indicates no known allergies.  Home Medications   Current Outpatient Rx  Name  Route  Sig  Dispense  Refill  . amoxicillin-clavulanate (AUGMENTIN) 875-125 MG per tablet   Oral   Take 1 tablet by mouth 2 (two) times daily.   20 tablet   0   . azithromycin (ZITHROMAX) 250 MG tablet      Take 2 tabs PO x 1 dose, then 1 tab PO QD x 4 days   6 tablet   0   . chlorpheniramine-HYDROcodone (TUSSIONEX PENNKINETIC ER) 10-8 MG/5ML LQCR   Oral   Take 5 mLs by mouth every 12 (twelve) hours as needed.   60 mL   0   . chlorpheniramine-HYDROcodone (TUSSIONEX) 10-8 MG/5ML LQCR   Oral   Take 5 mLs by mouth every 12 (twelve) hours as needed.   140 mL   0   . clarithromycin (BIAXIN XL) 500 MG 24 hr tablet   Oral   Take 2 tablets (1,000 mg total) by mouth daily.   14 tablet   0   . colchicine 0.6 MG tablet      Take 2 now and 1 in 1 hour.  May repeat dose once daily.  For gout attack.   12 tablet   0   . colchicine 0.6 MG tablet      2 tabs po today, then 1 q d for 6 days. Take with food.  8 tablet   0   . indomethacin (INDOCIN) 50 MG capsule   Oral   Take 1 capsule (50 mg total) by mouth 2 (two) times daily with a meal.   20 capsule   0   . levofloxacin (LEVAQUIN) 500 MG tablet   Oral   Take 1 tablet (500 mg total) by mouth daily.   7 tablet   0   . ondansetron (ZOFRAN-ODT) 8 MG disintegrating tablet   Oral   Take 1 tablet (8 mg total) by mouth every 8 (eight) hours as needed for nausea.   30 tablet   0   . pseudoephedrine-guaifenesin (MUCINEX D) 60-600 MG per tablet   Oral   Take 1 tablet by mouth every 12 (twelve) hours.   18 tablet   0    BP 117/78  Pulse 95  Temp(Src) 98.2 F (36.8 C) (Oral)  Resp 19  SpO2 98% Physical Exam  Nursing note and vitals reviewed. Constitutional: He is oriented to  person, place, and time. He appears well-developed and well-nourished. No distress.  HENT:  Head: Normocephalic.  Pulmonary/Chest: Effort normal. No respiratory distress. He has wheezes in the right middle field. He has rhonchi (diffuse). He has rales in the right middle field.  Neurological: He is alert and oriented to person, place, and time. Coordination normal.  Skin: Skin is warm and dry. No rash noted. He is not diaphoretic.  Psychiatric: He has a normal mood and affect. Judgment normal.    ED Course  Procedures (including critical care time) Labs Review Labs Reviewed - No data to display Imaging Review Dg Chest 2 View  12/06/2012   CLINICAL DATA:  Cough and fever  EXAM: CHEST  2 VIEW  COMPARISON:  DG CHEST 2 VIEW dated 06/24/2012  FINDINGS: Normal cardiac silhouette. There is the segmental airspace disease in the lateral right middle lobe and inferior right upper lobe. No pneumothorax.  IMPRESSION: Right middle lobe and upper lobe bronchopneumonia.   Electronically Signed   By: Genevive Bi M.D.   On: 12/06/2012 12:03     Rales in the same area as his previous episode of pneumonia. Getting a chest x-ray  MDM   1. Bronchopneumonia    The x-ray reveals a recurrence of pneumonia in the same spot as his previous pneumonia. I've spoken with the pulmonologist on call, they will like to see this patient to evaluate for any underlying pulmonary disease. They will call him today to set up an appointment. Starting treatment for community-acquired pneumonia here today.   Meds ordered this encounter  Medications  . cefTRIAXone (ROCEPHIN) injection 1 g    Sig:   . clarithromycin (BIAXIN XL) 500 MG 24 hr tablet    Sig: Take 2 tablets (1,000 mg total) by mouth daily.    Dispense:  14 tablet    Refill:  0    Order Specific Question:  Supervising Provider    Answer:  Bradd Canary D [[id number]]       Graylon Good, PA-C 12/06/12 1226

## 2012-12-07 ENCOUNTER — Encounter: Payer: Self-pay | Admitting: Internal Medicine

## 2012-12-07 ENCOUNTER — Ambulatory Visit (INDEPENDENT_AMBULATORY_CARE_PROVIDER_SITE_OTHER): Payer: 59 | Admitting: Internal Medicine

## 2012-12-07 VITALS — BP 122/80 | HR 102 | Temp 98.1°F | Ht 67.0 in | Wt 211.6 lb

## 2012-12-07 DIAGNOSIS — J180 Bronchopneumonia, unspecified organism: Secondary | ICD-10-CM

## 2012-12-07 MED ORDER — PREDNISONE (PAK) 10 MG PO TABS: ORAL_TABLET | ORAL | Status: DC

## 2012-12-07 MED ORDER — TRAMADOL HCL 50 MG PO TABS: ORAL_TABLET | ORAL | Status: DC

## 2012-12-07 NOTE — Patient Instructions (Addendum)
Try prilosec 20mg   Take 30-60 min before first meal of the day and Pepcid 20 mg one bedtime until cough is completely gone for at least a week without the need for cough suppression  For cough mucinex dm 1200 mg   every 12 hours and supplement if needed with  tramadol 50 mg up to 2 every 4 hours to suppress the urge to cough. Swallowing water or using ice chips/non mint and menthol containing candies (such as lifesavers or sugarless jolly ranchers) are also effective.  You should rest your voice and avoid activities that you know make you cough.  Once you have eliminated the cough for 3 straight days try reducing the tramadol first,  then the mucinex dm  Finish biaxin  If not improving Prednisone 10 mg take  4 each am x 2 days,   2 each am x 2 days,  1 each am x 2 days and stop    Please schedule a follow up office visit in 2 weeks, sooner if needed with cxr on return Add prevnar next ov and Tdap also

## 2012-12-07 NOTE — Progress Notes (Signed)
  Subjective:    Patient ID: Jesse Warner, male    DOB: Nov 07, 1965  MRN: [id number]  HPI  47 yowm quit smoking 2001 with no resp problems eval at [facility] April 2014 with pna and then again 12/06/2012  With dx also of pna so referred 12/07/2012 to pulmonary clinic by [facility]/ Dr Clementeen Graham   12/07/2012 1st [place] [facility] office visit/ Malachi Kinzler cc acutely ill April 2014 CAP 100% better and cxr cleared but then again acutely ill 10/24 fever malaise then cough 12/04/12 mostly dry then prod 10/27 so seen 10/28 with RLL pna rx biaxin and some better but cough can be severe esp early in am   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 No unusual exp hx or h/o childhood pna/ asthma or knowledge of premature birth.  Sleeping ok without nocturnal  or early am exacerbation  of respiratory  c/o's or need for noct saba. Also denies any obvious fluctuation of symptoms with weather or environmental changes or other aggravating or alleviating factors except as outlined above   Current Medications, Allergies, Complete Past Medical History, Past Surgical History, Family History, and Social History were reviewed in [employer] record.     Review of Systems  Constitutional: Positive for activity change. Negative for fever, chills, appetite change and unexpected weight change.  HENT: Negative for congestion, dental problem, postnasal drip, rhinorrhea, sneezing, sore throat, trouble swallowing and voice change.   Eyes: Negative for visual disturbance.  Respiratory: Positive for cough. Negative for choking and shortness of breath.   Cardiovascular: Negative for chest pain and leg swelling.  Gastrointestinal: Negative for nausea, vomiting and abdominal pain.  Genitourinary: Negative for difficulty urinating.  Musculoskeletal:  Negative for arthralgias.  Skin: Negative for rash.  Psychiatric/Behavioral: Negative for behavioral problems and confusion.       Objective:   Physical Exam   amb wm nad Wt Readings from Last 3 Encounters:  12/07/12 211 lb 9.6 oz (95.981 kg)  05/30/12 208 lb (94.348 kg)      HEENT: nl dentition, turbinates, and orophanx. Nl external ear canals without cough reflex   NECK :  without JVD/Nodes/TM/ nl carotid upstrokes bilaterally   LUNGS: no acc muscle use,  Min rhonchi   CV:  RRR  no s3 or murmur or increase in P2, no edema   ABD:  soft and nontender with nl excursion in the supine position. No bruits or organomegaly, bowel sounds nl  MS:  warm without deformities, calf tenderness, cyanosis or clubbing  SKIN: warm and dry without lesions    NEURO:  alert, approp, no deficits    cxr 12/06/12 Right middle lobe and upper lobe bronchopneumonia.     Assessment & Plan:

## 2012-12-08 DIAGNOSIS — J180 Bronchopneumonia, unspecified organism: Secondary | ICD-10-CM | POA: Insufficient documentation

## 2012-12-08 NOTE — Assessment & Plan Note (Signed)
Different lobes involved with complete clearing on the last episode with main risk factor using MJ > strongly discouraged.  Needs prevar on return and rx of acute symptoms and f/u cxr in 2 weeks but do not see need for fob at this point nor w/u for immune deficiency.

## 2012-12-08 NOTE — ED Provider Notes (Signed)
Medical screening examination/treatment/procedure(s) were performed by a resident physician or non-physician practitioner and as the supervising physician I was immediately available for consultation/collaboration.  Evan Corey, MD   Evan S Corey, MD 12/08/12 0755 

## 2012-12-21 ENCOUNTER — Encounter: Payer: Self-pay | Admitting: Internal Medicine

## 2012-12-21 ENCOUNTER — Ambulatory Visit (INDEPENDENT_AMBULATORY_CARE_PROVIDER_SITE_OTHER)
Admission: RE | Admit: 2012-12-21 | Discharge: 2012-12-21 | Disposition: A | Discharge status: Home / Self Care | Payer: 59 | Source: Ambulatory Visit | Attending: Internal Medicine | Admitting: Internal Medicine

## 2012-12-21 ENCOUNTER — Ambulatory Visit (INDEPENDENT_AMBULATORY_CARE_PROVIDER_SITE_OTHER): Payer: 59 | Admitting: Internal Medicine

## 2012-12-21 VITALS — BP 122/88 | HR 110 | Temp 98.4°F | Ht 67.0 in | Wt 210.6 lb

## 2012-12-21 DIAGNOSIS — Z23 Encounter for immunization: Secondary | ICD-10-CM

## 2012-12-21 DIAGNOSIS — J180 Bronchopneumonia, unspecified organism: Secondary | ICD-10-CM

## 2012-12-21 NOTE — Patient Instructions (Signed)
Prevnar and tdap today   If you have any sinus problems at all you need to call [facility] Sinus CT at [phone]  For any respiratory symptoms immediately start prilosec Take 30-60 min before first meal of the day in addition to  pepcid at bedtime and mucinex dm as needed and call for appt to see me or Tammy NP

## 2012-12-21 NOTE — Progress Notes (Signed)
Subjective:    Patient ID: Jesse Warner, male    DOB: 1965-03-15  MRN: [id number]    Brief patient profile:  47 yowm quit smoking 2001 with no resp problems eval at [facility] April 2014 with pna and then again 12/06/2012  With dx also of pna so referred 12/07/2012 to pulmonary clinic by [facility]/ Dr Clementeen Graham    History of Present Illness  12/07/2012 1st [place] [facility] office visit/ Jesse Warner cc acutely ill April 2014 CAP 100% better and cxr cleared but then again acutely ill 10/24 fever malaise then cough 12/04/12 mostly dry then prod 10/27 so seen 10/28 with RLL pna rx biaxin and some better but cough can be severe esp early in am   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   mucinex dm Finish biaxin If not improving Prednisone 10 mg take  4 each am x 2 days,   2 each am x 2 days,  1 each am x 2 days and stop        12/21/2012 f/u ov/Jesse Warner re: recurrent pna, did not need prednisone  Chief Complaint  Patient presents with  . Follow-up    CXR done today.  Breathing is doing better.  (Family now wtih pneumonia and bronch)    In retrospect having lots of gerd, variable nasal congestion but no purulent nasal secretions  No obvious day to day or daytime variabilty or assoc chronic cough or cp or chest tightness, subjective wheezes. No unusual exp hx or h/o childhood pna/ asthma or knowledge of premature birth.  Sleeping ok without nocturnal  or early am exacerbation  of respiratory  c/o's or need for noct saba. Also denies any obvious fluctuation of symptoms with weather or environmental changes or other aggravating or alleviating  factors except as outlined above   Current Medications, Allergies, Complete Past Medical History, Past Surgical History, Family History, and Social History were reviewed in [employer] record.  ROS  The following are not active complaints unless bolded sore throat, dysphagia, dental problems, itching, sneezing,  nasal congestion or excess/ purulent secretions, ear ache,   fever, chills, sweats, unintended wt loss, pleuritic or exertional cp, hemoptysis,  orthopnea pnd or leg swelling, presyncope, palpitations, heartburn, abdominal pain, anorexia, nausea, vomiting, diarrhea  or change in bowel or urinary habits, change in stools or urine, dysuria,hematuria,  rash, arthralgias, visual complaints, headache, numbness weakness or ataxia or problems with walking or coordination,  change in mood/affect or memory.                  Objective:   Physical Exam   amb wm nad  Wt Readings from Last 3 Encounters:  12/21/12 210 lb 9.6 oz (95.528 kg)  12/07/12 211 lb 9.6 oz (95.981 kg)  05/30/12 208 lb (94.348 kg)         HEENT: nl dentition, turbinates, and orophanx. Nl external ear canals without cough reflex   NECK :  without JVD/Nodes/TM/ nl carotid upstrokes bilaterally   LUNGS: no acc muscle use,  Min rhonchi  CV:  RRR  no s3 or murmur or increase in P2, no edema   ABD:  soft and nontender with nl excursion in the supine position. No bruits or organomegaly, bowel sounds nl  MS:  warm without deformities, calf tenderness, cyanosis or clubbing  SKIN: warm and dry without lesions    NEURO:  alert, approp, no deficits    cxr 12/21/12 No active cardiopulmonary disease. Resolved right lung pneumonia.      Assessment & Plan:

## 2012-12-22 NOTE — Assessment & Plan Note (Signed)
Completely cleared with main issue why he keeps getting pna.  He had no childhood pna or tendency to resp infections so very unlikely he has any form of immune deficiency.  He does have mod obesity/ intermittent gerd and ? Underlying sinus dz but not enough to warrant further w/u at this point  For now just needs prevnar, diet, wt loss and avoid smoking MJ  F/u is prn

## 2012-12-22 NOTE — Progress Notes (Signed)
Quick Note:  Patient returned call. Advised of cxr results / recs as stated by MW. Pt verbalized understanding and denied any questions. ______ 

## 2013-08-23 ENCOUNTER — Encounter: Payer: Self-pay | Admitting: Internal Medicine

## 2013-08-23 ENCOUNTER — Ambulatory Visit (INDEPENDENT_AMBULATORY_CARE_PROVIDER_SITE_OTHER): Payer: 59 | Admitting: Internal Medicine

## 2013-08-23 VITALS — BP 124/80 | HR 80 | Temp 97.7°F | Ht 67.0 in | Wt 206.0 lb

## 2013-08-23 DIAGNOSIS — J069 Acute upper respiratory infection, unspecified: Secondary | ICD-10-CM

## 2013-08-23 MED ORDER — AZITHROMYCIN 250 MG PO TABS: ORAL_TABLET | ORAL | Status: DC

## 2013-08-23 NOTE — Progress Notes (Signed)
Subjective:   Patient ID: Jesse Warner, male    DOB: 1965/12/02  MRN: [id number]    Brief patient profile:  47 yowm quit smoking 2001 with no resp problems eval at [facility] April 2014 with pna and then again 12/06/2012  With dx also of pna so referred 12/07/2012 to pulmonary clinic by [facility]/ Dr Clementeen Graham    History of Present Illness  12/07/2012 1st [place] [facility] office visit/ Xolani Degracia cc acutely ill April 2014 CAP 100% better and cxr cleared but then again acutely ill 10/24 fever malaise then cough 12/04/12 mostly dry then prod 10/27 so seen 10/28 with RLL pna rx biaxin and some better but cough can be severe esp early in am  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   mucinex dm Finish biaxin If not improving Prednisone 10 mg take  4 each am x 2 days,   2 each am x 2 days,  1 each am x 2 days and stop        12/21/2012 f/u ov/Kalvyn Desa re: recurrent pna, did not need prednisone  Chief Complaint  Patient presents with  . Follow-up    CXR done today.  Breathing is doing better.  (Family now wtih pneumonia and bronch)  In retrospect having lots of gerd, variable nasal congestion but no purulent nasal secretions rec Prevnar and tdap today  If you have any sinus problems at all you need to call [facility] Sinus CT at [phone]> never done  For any respiratory symptoms immediately start prilosec Take 30-60 min before first meal of the day in addition to  pepcid at bedtime and mucinex dm as needed and call for appt to see me or Tammy NP     08/23/2013 acute ov/Auburn Hester re: recurrent cough Chief Complaint  Patient presents with  .  Follow-up    Pt c/o non prod cough x 1 wk  started on robitussin dm didn't work mostly am clear, some green bloods  Did not start zpak   Or gerd rx Abruptly ill at onset with sratchy throat, nasal congestion, no fever or cp or sob  No obvious day to day or daytime variabilty or assoc chronic cough or cp or chest tightness, subjective wheeze overt sinus or hb symptoms. No unusual exp hx or h/o childhood pna/ asthma or knowledge of premature birth.  Sleeping ok without nocturnal  or early am exacerbation  of respiratory  c/o's or need for noct saba. Also denies any obvious fluctuation of symptoms with weather or environmental changes or other aggravating or alleviating factors except as outlined above   Current Medications, Allergies, Complete Past Medical History, Past Surgical History, Family History, and Social History were reviewed in [employer] record.  ROS  The following are not active complaints unless bolded sore throat, dysphagia, dental problems, itching, sneezing,  nasal congestion or excess/ purulent secretions, ear ache,   fever, chills, sweats, unintended wt loss, pleuritic or exertional cp, hemoptysis,  orthopnea pnd or leg swelling, presyncope, palpitations, heartburn, abdominal pain, anorexia, nausea, vomiting, diarrhea  or change in bowel or urinary habits, change in stools or urine, dysuria,hematuria,  rash, arthralgias, visual complaints, headache, numbness weakness or ataxia or problems with walking or coordination,  change in mood/affect or memory.        No obvious day to day or daytime variabilty or assoc   chest tightness, subjective wheezes. No unusual exp hx or h/o childhood pna/ asthma or knowledge of premature birth.  Sleeping ok without nocturnal  or early am exacerbation  of respiratory  c/o's or need for noct saba. Also denies any obvious fluctuation of symptoms with weather or environmental changes or other aggravating or alleviating factors  except as outlined above   Current Medications, Allergies, Complete Past Medical History, Past Surgical History, Family History, and Social History were [facility] in [facility].  ROS  The following are not active complaints unless bolded sore throat, dysphagia, dental problems, itching, sneezing,  nasal congestion or excess/ purulent secretions, ear ache,   fever, chills, sweats, unintended wt loss, pleuritic or exertional cp, hemoptysis,  orthopnea pnd or leg swelling, presyncope, palpitations, heartburn, abdominal pain, anorexia, nausea, vomiting, diarrhea  or change in bowel or urinary habits, change in stools or urine, dysuria,hematuria,  rash, arthralgias, visual complaints, headache, numbness weakness or ataxia or problems with walking or coordination,  change in mood/affect or memory.                  Objective:   Physical Exam   amb wm nad  08/23/13             206  Wt Readings from Last 3 Encounters:  12/21/12 210 lb 9.6 oz (95.528 kg)  12/07/12 211 lb 9.6 oz (95.981 kg)  05/30/12 208 lb (94.348 kg)         HEENT: nl dentition, turbinates, and orophanx. Nl external ear canals without cough reflex   NECK :  without JVD/Nodes/TM/ nl carotid upstrokes bilaterally   LUNGS: no acc muscle use,  Min rhonchi   CV:  RRR  no s3 or murmur or increase in P2, no edema   ABD:  soft and nontender with nl excursion in the supine position. No bruits or organomegaly, bowel sounds nl  MS:  warm without deformities, calf tenderness, cyanosis or clubbing  SKIN: warm and dry without lesions    NEURO:  alert, approp, no deficits    cxr 12/21/12 No active cardiopulmonary disease. Resolved right lung pneumonia.      Assessment & Plan:

## 2013-08-23 NOTE — Patient Instructions (Addendum)
mucinex dm 1200 mg every 12 hours   Zpak  Try prilosec 20mg   Take 30-60 min before first meal of the day and Pepcid 20 mg one bedtime until cough is completely gone for at least a week without the need for cough suppression  GERD (REFLUX)  is an extremely common cause of respiratory symptoms, many times with no significant heartburn at all.    It can be treated with medication, but also with lifestyle changes including avoidance of late meals, excessive alcohol, smoking cessation, and avoid fatty foods, chocolate, peppermint, colas, red wine, and acidic juices such as orange juice.  NO MINT OR MENTHOL PRODUCTS SO NO COUGH DROPS  USE SUGARLESS CANDY INSTEAD (jolley ranchers or Stover's)  NO OIL BASED VITAMINS - use powdered substitutes.    Call if not all better for follow up next week

## 2013-08-25 DIAGNOSIS — J069 Acute upper respiratory infection, unspecified: Secondary | ICD-10-CM | POA: Insufficient documentation

## 2013-08-25 NOTE — Assessment & Plan Note (Signed)
Explained natural history of uri and why it's necessary in patients at risk to treat GERD aggressively  at least  short term   to reduce risk of evolving cyclical cough initially  triggered by epithelial injury and a heightened sensitivty to the effects of any upper airway irritants,  most importantly acid - related.  That is, the more sensitive the epithelium damaged for virus, the more the cough, the more the secondary reflux (especially in those prone to reflux) the more the irritation of the sensitive mucosa and so on in a cyclical pattern.  No evidence of sign pna at this point > rec zpak and f/u 08/28/13 if not improving, call in meantime if worse   See instructions for specific recommendations which were reviewed directly with the patient who was given a copy with highlighter outlining the key components.

## 2014-12-21 IMAGING — CR DG CHEST 2V
2 series · 2 of 2 positions shown · non-contrast
Comparison: 06/05/2012

CLINICAL DATA: Gout, pneumonia

CHEST - 2 VIEW

[view not recorded (1 of 2)]
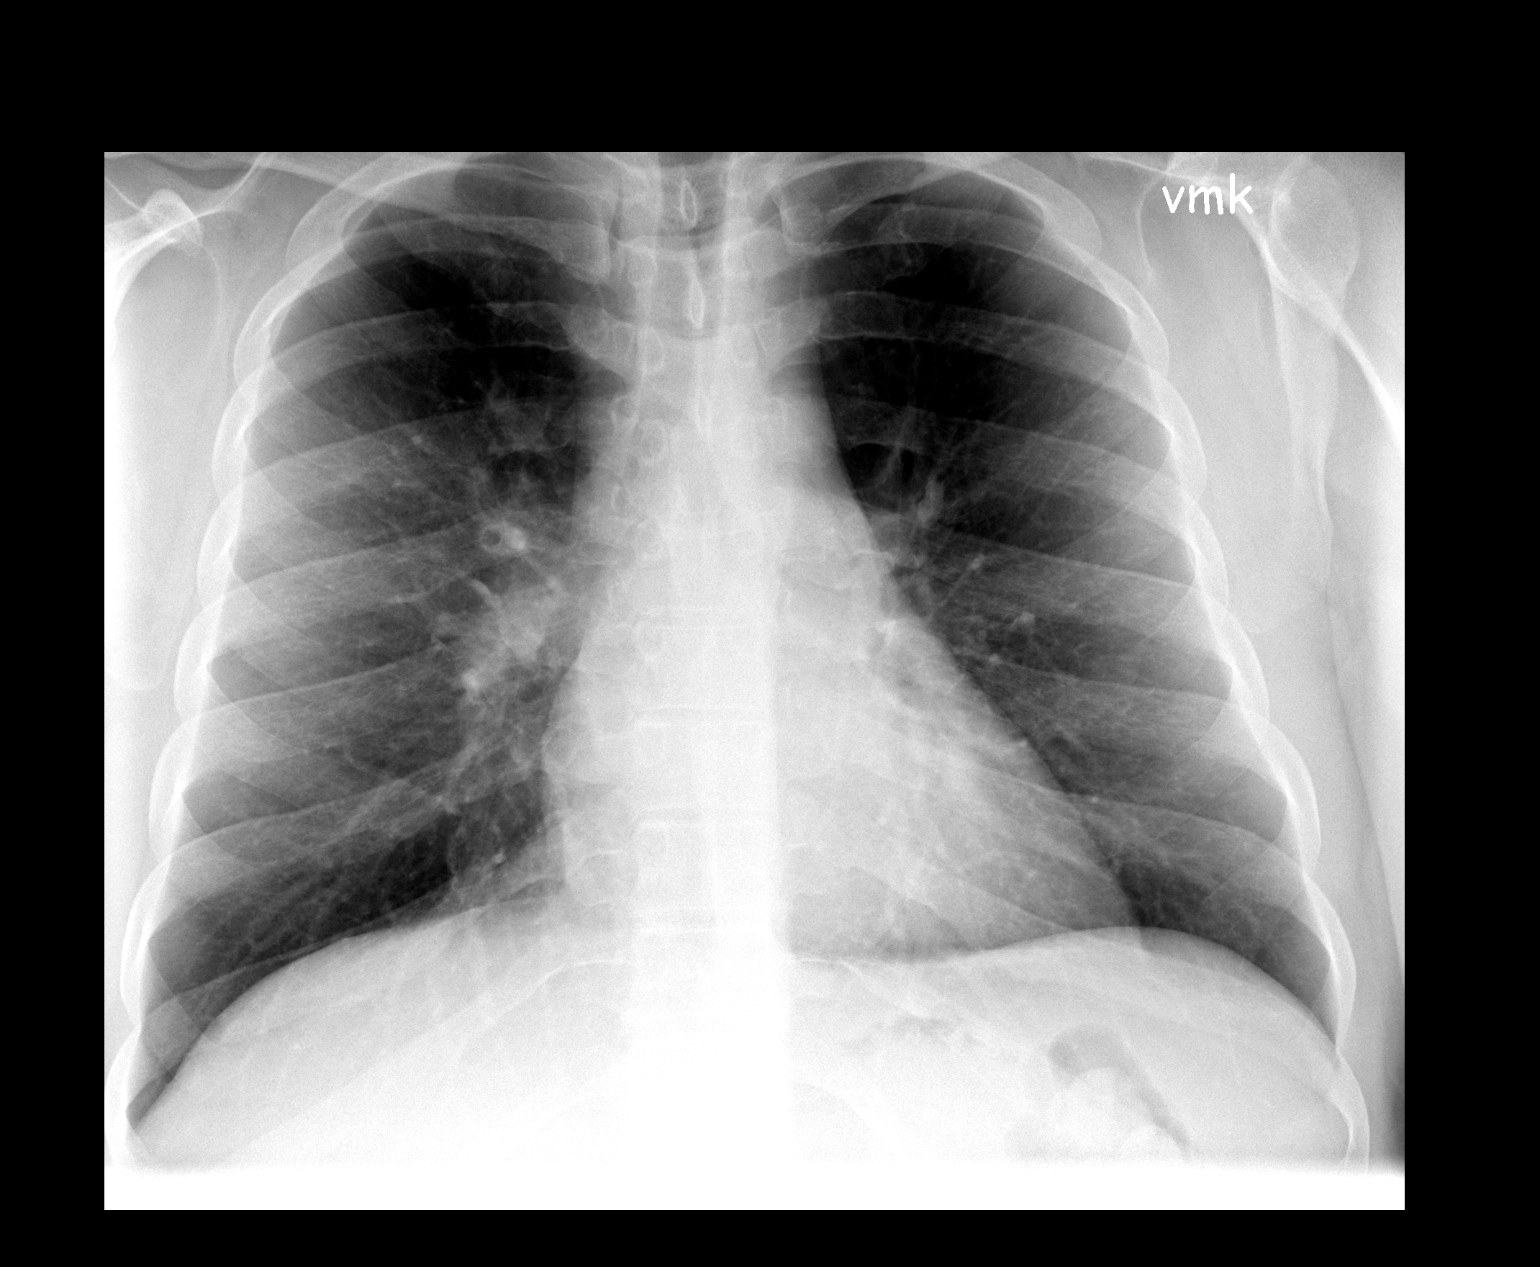

[view not recorded (2 of 2)]
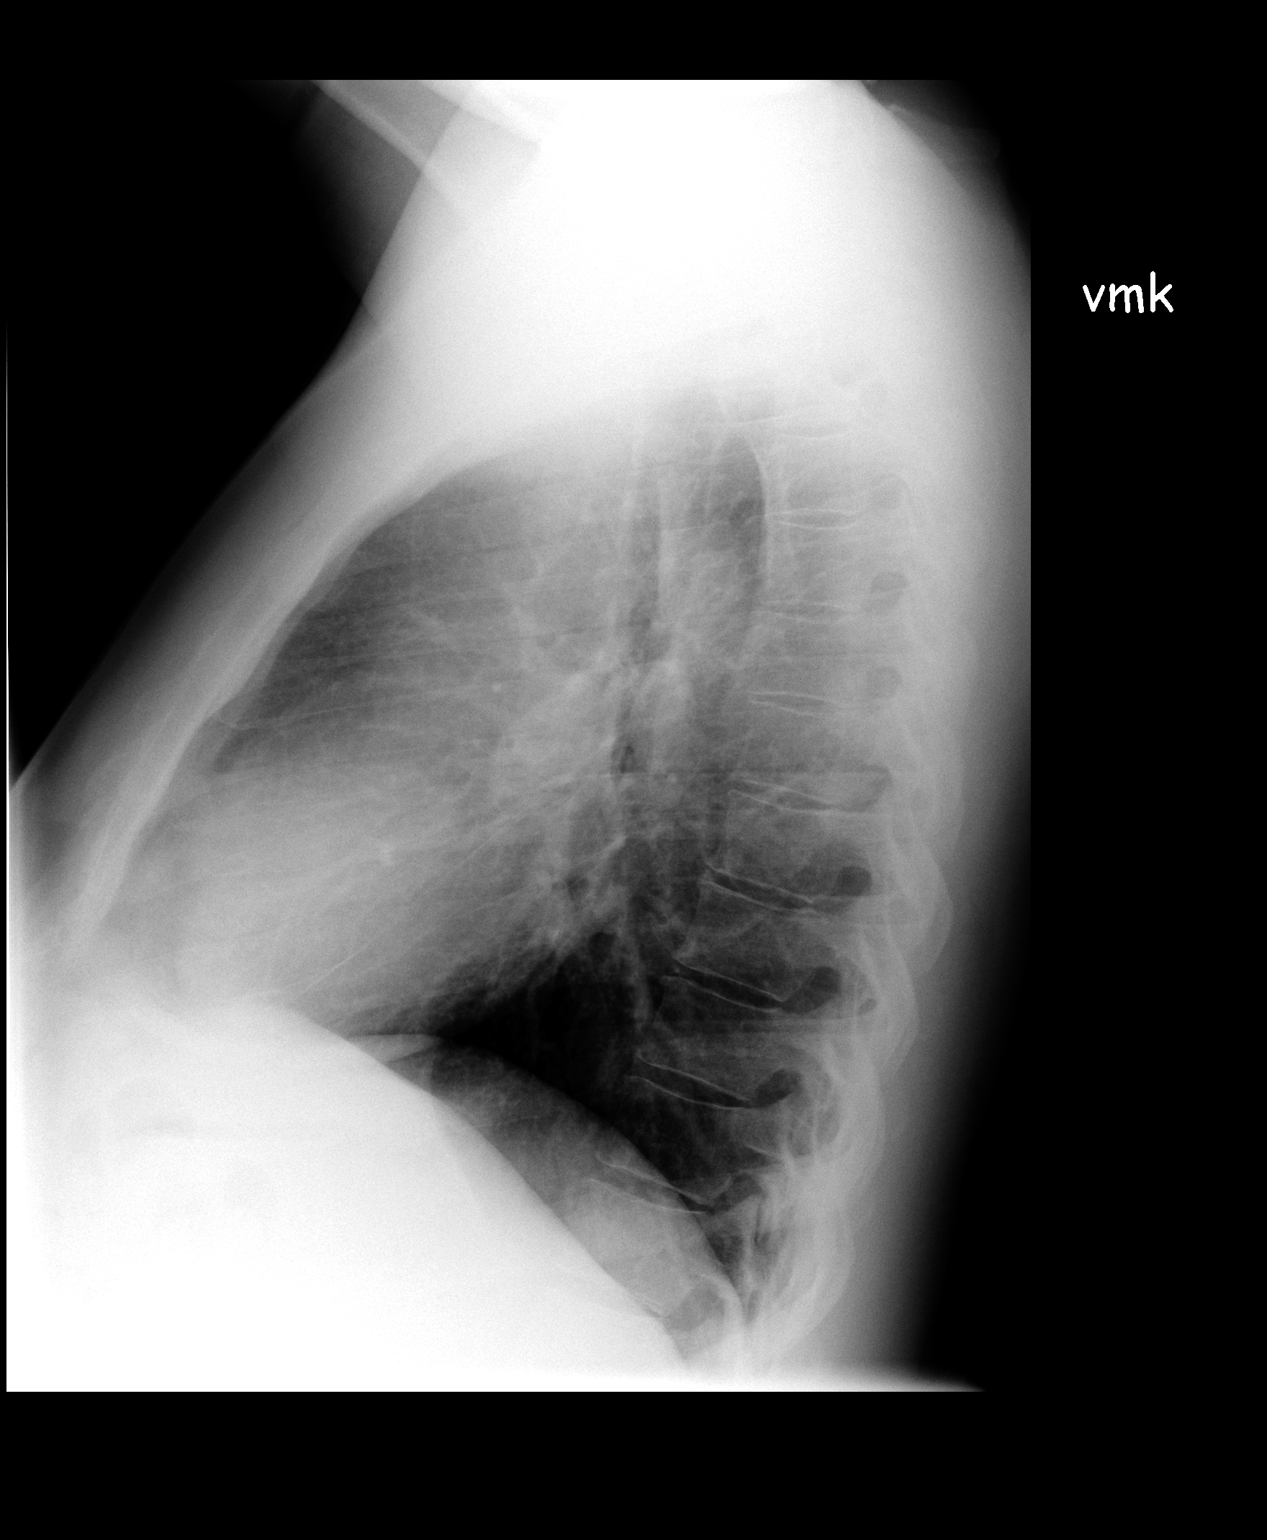

[2 of 2 positions shown; findings below may reference images not displayed]

FINDINGS: Right upper lobe infiltrate is clear.  Lungs currently
show no evidence of pneumonia.  No mass or adenopathy.  No pleural
effusion.
IMPRESSION: No active cardiopulmonary disease.  Interval clearing of right
upper lobe pneumonia.

## 2015-06-04 IMAGING — CR DG CHEST 2V
2 series · 2 of 2 positions shown · non-contrast
Comparison: DG CHEST 2 VIEW dated 06/24/2012

CLINICAL DATA: Cough and fever

EXAM:
CHEST  2 VIEW

[view not recorded (1 of 2)]
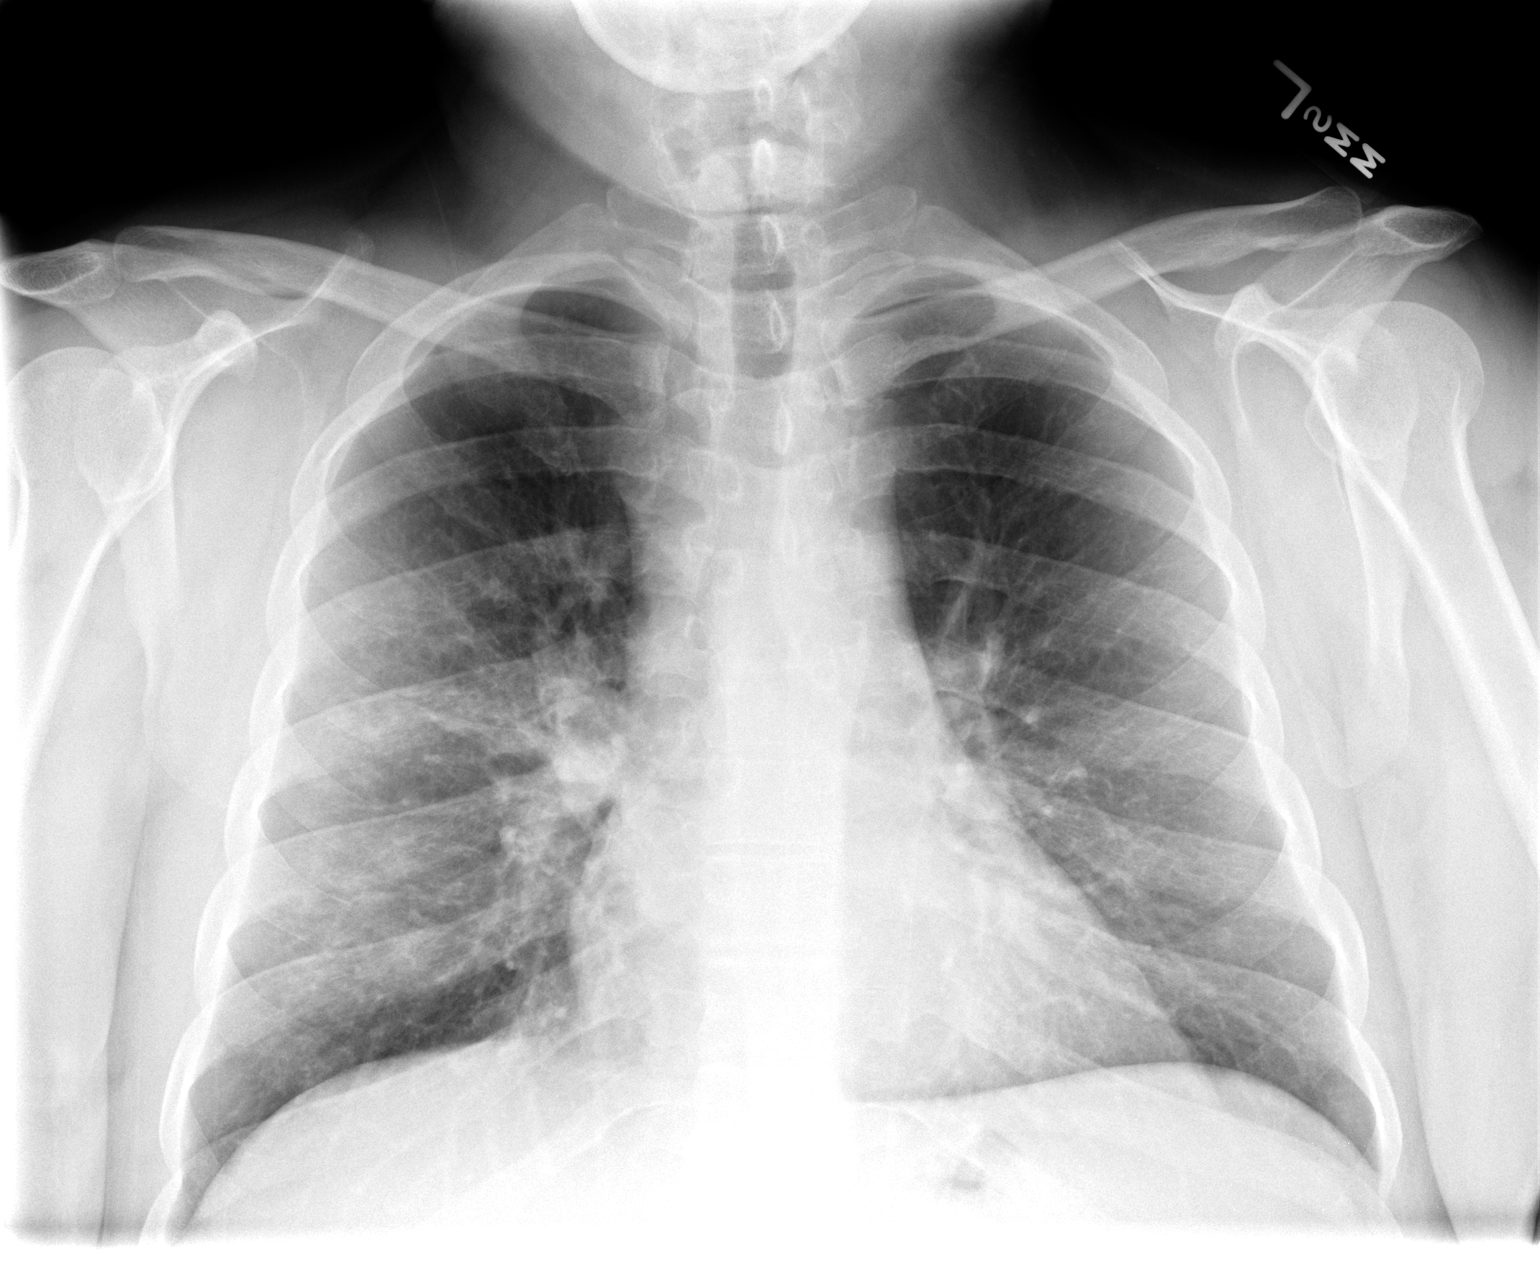

[view not recorded (2 of 2)]
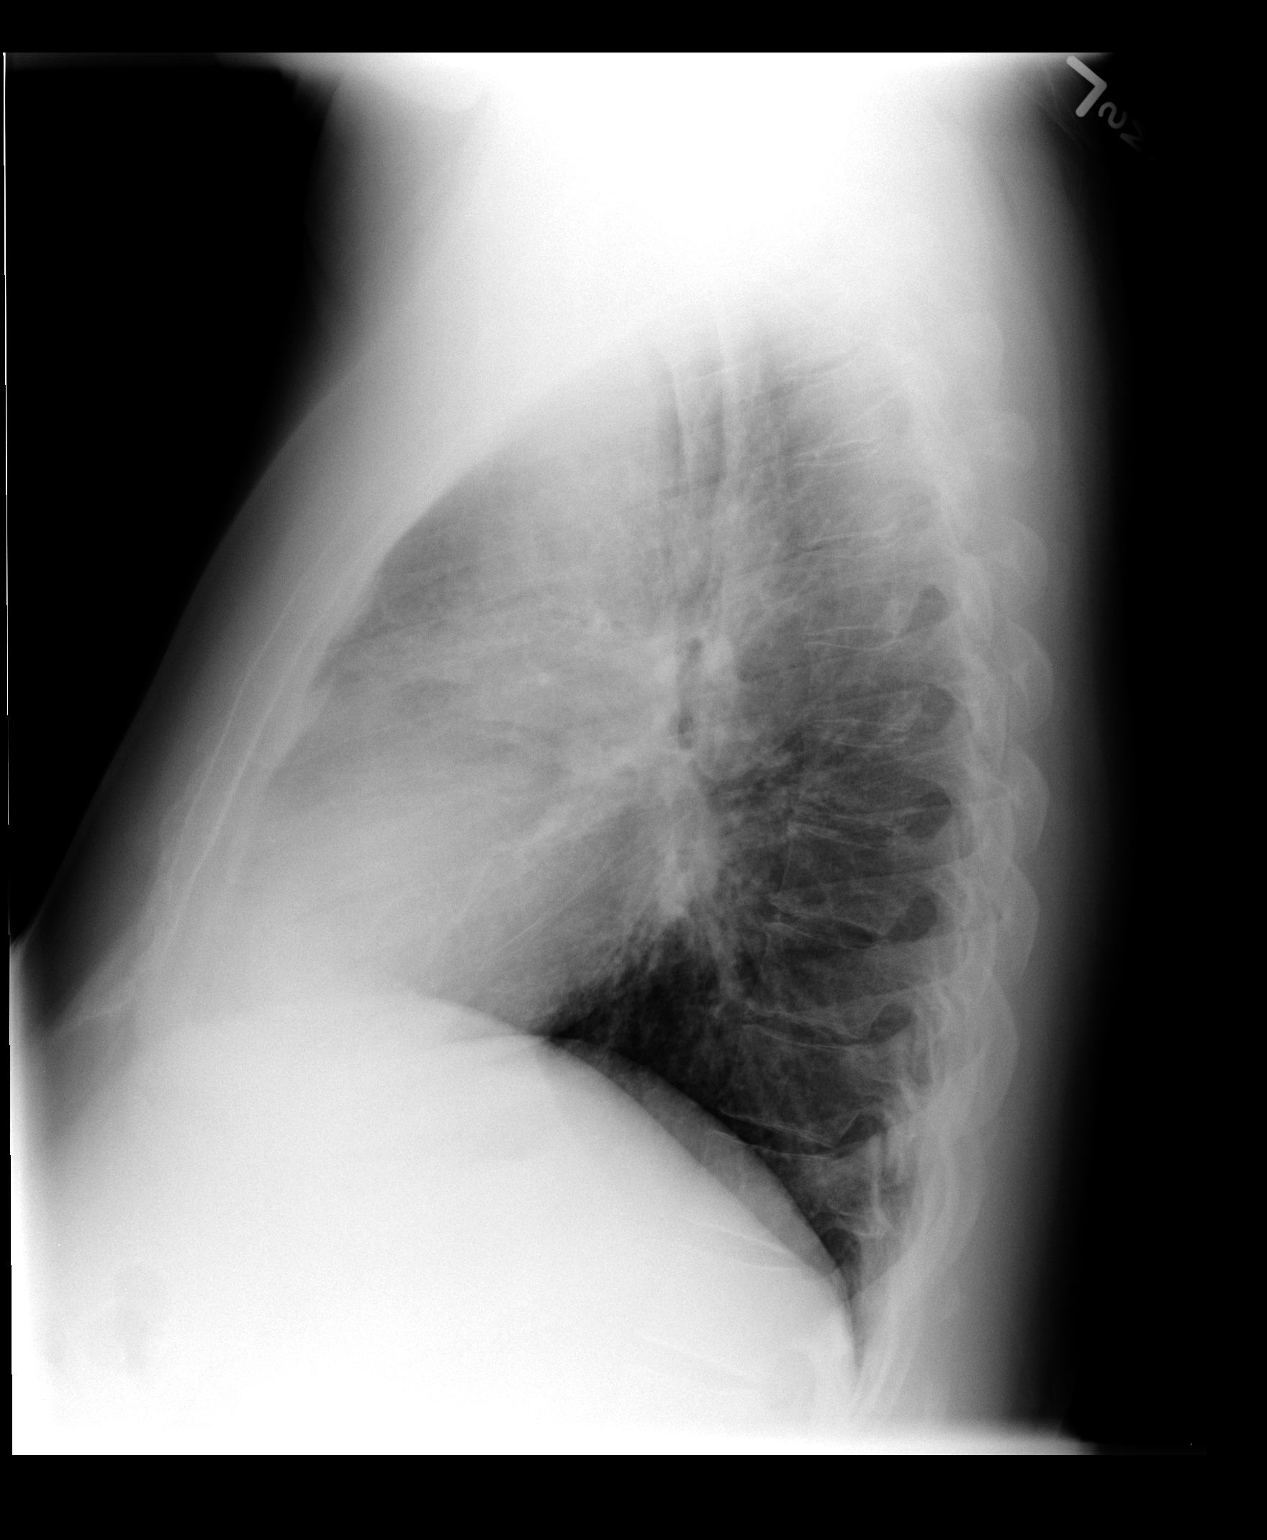

[2 of 2 positions shown; findings below may reference images not displayed]

FINDINGS: Normal cardiac silhouette. There is the segmental airspace disease
in the lateral right middle lobe and inferior right upper lobe. No
pneumothorax.
IMPRESSION: Right middle lobe and upper lobe bronchopneumonia.

## 2015-09-15 ENCOUNTER — Ambulatory Visit (HOSPITAL_COMMUNITY)
Admission: EM | Admit: 2015-09-15 | Discharge: 2015-09-15 | Disposition: A | Discharge status: Home / Self Care | Payer: 59 | Attending: Emergency Medicine | Admitting: Emergency Medicine

## 2015-09-15 ENCOUNTER — Encounter (HOSPITAL_COMMUNITY): Payer: Self-pay | Admitting: Emergency Medicine

## 2015-09-15 DIAGNOSIS — F329 Major depressive disorder, single episode, unspecified: Secondary | ICD-10-CM | POA: Diagnosis not present

## 2015-09-15 DIAGNOSIS — F32A Depression, unspecified: Secondary | ICD-10-CM

## 2015-09-15 MED ORDER — CITALOPRAM HYDROBROMIDE 20 MG PO TABS: 20.0000 mg | ORAL_TABLET | Freq: Every day | ORAL | 0 refills | Status: DC

## 2015-09-15 NOTE — Discharge Instructions (Signed)
YOU WILL NEED TO FIND A PROVIDER THAT WILL MONITOR AND ADJUST YOUR MEDICATION AS NEEDED.   IF YOU START TO HAVE ANY SUICIDAL THOUGHTS YOU SHOULD CALL [phone] FOR HELP

## 2015-09-15 NOTE — ED Triage Notes (Signed)
[facility] with a complaint of a new onset of depression related to some changes in his life. The patient stated that he has no past hx of medication for depression.

## 2015-09-15 NOTE — ED Provider Notes (Signed)
CSN: [id number]     Arrival date & time 09/15/15  1205 History   First MD Initiated Contact with Patient 09/15/15 1301     Chief Complaint  Patient presents with  . Depression   (Consider location/radiation/quality/duration/timing/severity/associated sxs/prior Treatment) HPI 50 year old male presents to the urgent care this morning with depression. He states that about a month ago his wife left him she stated several months ago that she was in a lesbian relationship but only a month ago left the house leaving him with the children. He states that he just does not have any energy to do anything and would like some help. He states he has been to tube therapy sessions neither of them really helped him a great deal. He denies any suicidal ideations at this time. Past Medical History:  Diagnosis Date  . Gout   . Hepatitis   . Mononucleosis, infectious, with hepatitis   . Pneumonia    Past Surgical History:  Procedure Laterality Date  . TONSILLECTOMY     History reviewed. No pertinent family history. Social History  Substance Use Topics  . Smoking status: Former Smoker    Packs/day: 1.00    Years: 12.00    Types: Cigarettes    Quit date: 02/10/1999  . Smokeless tobacco: Never Used  . Alcohol use Yes     Comment: wkends    Review of Systems  Denies: HEADACHE, NAUSEA, ABDOMINAL PAIN, CHEST PAIN, CONGESTION, DYSURIA, SHORTNESS OF BREATH  Allergies  Review of patient's allergies indicates no known allergies.  Home Medications   Prior to Admission medications   Medication Sig Start Date End Date Taking? Authorizing Provider  azithromycin (ZITHROMAX) 250 MG tablet Take 2 on day one then 1 daily x 4 days 08/23/13   Nyoka Cowden, MD  citalopram (CELEXA) 20 MG tablet Take 1 tablet (20 mg total) by mouth daily. 09/15/15   Tharon Aquas, PA   Meds Ordered and Administered this Visit  Medications - No data to display  BP 121/77 (BP Location: Left Arm)   Pulse 79   Temp 98.1 F (36.7  C) (Oral)   SpO2 99%  No data found.   Physical Exam NURSES NOTES AND VITAL SIGNS REVIEWED. CONSTITUTIONAL: Well developed, well nourished, no acute distress, depressed affect. HEENT: normocephalic, atraumatic EYES: Conjunctiva normal NECK:normal ROM, supple, no adenopathy PULMONARY:No respiratory distress, normal effort ABDOMINAL: Soft, ND, NT BS+, No CVAT MUSCULOSKELETAL: Normal ROM of all extremities,  SKIN: warm and dry without rash PSYCHIATRIC: Mood and affect, behavior are normal  Urgent Care Course   Clinical Course    Procedures (including critical care time)  Labs Review Labs Reviewed - No data to display  Imaging Review No results found.   Visual Acuity Review  Right Eye Distance:   Left Eye Distance:   Bilateral Distance:    Right Eye Near:   Left Eye Near:    Bilateral Near:       Prescription for citalopram is provided to the patient. Review of cyanosis suicidal ideation time. Sad cages assessment. Patient does have loss of interest in activities sleep disturbances change in his appetite depressed mood difficulty concentrating decreased activity and loss of energy he has had a suicidal thought but no plans or attempts. He states he has no guilt. He states that he is willing to see someone for follow-up.  MDM   1. Depression     Patient is reassured that there are no issues that require transfer to higher level of  care at this time or additional tests. Patient is advised to continue home symptomatic treatment. Patient is advised that if there are new or worsening symptoms to attend the emergency department, contact primary care provider, or return to UC. Instructions of care provided discharged home in stable condition.    THIS NOTE WAS GENERATED USING A VOICE RECOGNITION SOFTWARE PROGRAM. ALL REASONABLE EFFORTS  WERE MADE TO PROOFREAD THIS DOCUMENT FOR ACCURACY.  I have verbally reviewed the discharge instructions with the patient. A printed AVS  was given to the patient.  All questions were answered prior to discharge.      Tharon AquasFrank C Ram Haugan, PA 09/15/15 2115

## 2015-10-12 ENCOUNTER — Encounter (HOSPITAL_COMMUNITY): Payer: Self-pay | Admitting: Emergency Medicine

## 2015-10-12 ENCOUNTER — Ambulatory Visit (HOSPITAL_COMMUNITY)
Admission: EM | Admit: 2015-10-12 | Discharge: 2015-10-12 | Disposition: A | Discharge status: Home / Self Care | Payer: 59 | Attending: Family Medicine | Admitting: Family Medicine

## 2015-10-12 DIAGNOSIS — T887XXA Unspecified adverse effect of drug or medicament, initial encounter: Secondary | ICD-10-CM | POA: Diagnosis not present

## 2015-10-12 DIAGNOSIS — T50905A Adverse effect of unspecified drugs, medicaments and biological substances, initial encounter: Secondary | ICD-10-CM

## 2015-10-12 DIAGNOSIS — F329 Major depressive disorder, single episode, unspecified: Secondary | ICD-10-CM

## 2015-10-12 DIAGNOSIS — F32A Depression, unspecified: Secondary | ICD-10-CM

## 2015-10-12 MED ORDER — BUPROPION HCL ER (XL) 150 MG PO TB24: 150.0000 mg | ORAL_TABLET | Freq: Every day | ORAL | 0 refills | Status: DC

## 2015-10-12 NOTE — ED Triage Notes (Signed)
Pt is here for a refill on his Citalopram   Has a concern about medication... Reports he can't keep an erection since being on medication.   Denies self-harm or towards others  Voices no new concerns.... Has appt w/PCP in October   A&O x4... NAD

## 2015-10-12 NOTE — ED Provider Notes (Signed)
CSN: [id number]     Arrival date & time 10/12/15  1526 History   First MD Initiated Contact with Patient 10/12/15 1702     Chief Complaint  Patient presents with  . Medication Refill   (Consider location/radiation/quality/duration/timing/severity/associated sxs/prior Treatment) 50 year old male presents for follow-up for depressive symptoms. He presented to the Urgent Care on 09/17/15 and was started on Celexa. This has helped his mood and he feels better and mood more stable. He denies any suicidal thoughts. However, he is experiencing sexual side effects from the medication and would like to switch to a different medication. He has been trying to get established with a PCP but is unable to see a PCP until October. He takes no other daily medication.       Past Medical History:  Diagnosis Date  . Gout   . Hepatitis   . Mononucleosis, infectious, with hepatitis   . Pneumonia    Past Surgical History:  Procedure Laterality Date  . TONSILLECTOMY     History reviewed. No pertinent family history. Social History  Substance Use Topics  . Smoking status: Former Smoker    Packs/day: 1.00    Years: 12.00    Types: Cigarettes    Quit date: 02/10/1999  . Smokeless tobacco: Never Used  . Alcohol use Yes     Comment: wkends    Review of Systems  Constitutional: Negative for appetite change, fatigue and unexpected weight change.  Respiratory: Negative for chest tightness and shortness of breath.   Cardiovascular: Negative for chest pain and palpitations.  Gastrointestinal: Negative for constipation, diarrhea, nausea and vomiting.  Genitourinary: Negative for dysuria, frequency, penile pain and testicular pain.       Difficulty with erection  Neurological: Negative for dizziness, syncope, weakness, light-headedness and headaches.  Psychiatric/Behavioral: Positive for decreased concentration. Negative for agitation, behavioral problems, confusion, hallucinations, self-injury and suicidal  ideas. Sleep disturbance: difficulty mantaining sleep. The patient is not nervous/anxious and is not hyperactive.     Allergies  Review of patient's allergies indicates no known allergies.  Home Medications   Prior to Admission medications   Medication Sig Start Date End Date Taking? Authorizing Provider  citalopram (CELEXA) 20 MG tablet Take 1 tablet (20 mg total) by mouth daily. 09/15/15  Yes Tharon Aquas, PA  buPROPion (WELLBUTRIN XL) 150 MG 24 hr tablet Take 1 tablet (150 mg total) by mouth daily. 10/12/15   Sudie Grumbling, NP   Meds Ordered and Administered this Visit  Medications - No data to display  BP 107/68 (BP Location: Left Arm)   Pulse 85   Temp 98.2 F (36.8 C) (Oral)   Resp 16   SpO2 97%  No data found.   Physical Exam  Constitutional: He is oriented to person, place, and time. He appears well-developed and well-nourished. No distress.  Neck: Normal range of motion. Neck supple.  Cardiovascular: Normal rate, regular rhythm and normal heart sounds.   Pulmonary/Chest: Effort normal and breath sounds normal.  Lymphadenopathy:    He has no cervical adenopathy.  Neurological: He is alert and oriented to person, place, and time. He has normal strength.  Skin: Skin is warm and dry.  Psychiatric: He has a normal mood and affect. His speech is normal and behavior is normal. Judgment and thought content normal. Cognition and memory are normal.    Urgent Care Course   Clinical Course    Procedures (including critical care time)  Labs Review Labs Reviewed - No data  to display  Imaging Review No results found.   Visual Acuity Review  Right Eye Distance:   Left Eye Distance:   Bilateral Distance:    Right Eye Near:   Left Eye Near:    Bilateral Near:         MDM   1. Depression   2. Medication side effect, initial encounter    Reviewed various options to help with side effects- may continue current medication and add another medication to  counteract side effect or may switch to another anti-depressant completely. Patient chose to switch from Celexa to Wellbutrin. Recommend take 1/2 tablet of Celexa for next 4 days while at the same time starting Wellbutrin 150mg - take 1/2 tablet for 3 days then 1 whole tablet daily #30. Discussed that he should continue to contact other primary care providers to schedule an appointment sooner than October if possible. Monitor for any side effects of Wellbutrin and follow-up here if needed. Otherwise follow-up with a primary care provider as planned.     Sudie GrumblingAnn Berry Amyot, NP 10/13/15 [phone]

## 2015-10-12 NOTE — Discharge Instructions (Signed)
Take 1/2 tablet of Celexa for next 4 days. Start Wellbutrin tomorrow and take 1/2 tablet for 1st 3 days then 1 whole tablet daily. Encouraged to continue to explore primary care providers who are accepting patients within the next 30 days. Follow-up with a primary care provider as planned.

## 2015-11-04 ENCOUNTER — Telehealth: Payer: Self-pay | Admitting: General Practice

## 2015-11-05 NOTE — Telephone Encounter (Signed)
error 

## 2015-11-25 ENCOUNTER — Encounter: Payer: Self-pay | Admitting: Family Medicine

## 2015-11-25 ENCOUNTER — Ambulatory Visit (INDEPENDENT_AMBULATORY_CARE_PROVIDER_SITE_OTHER): Payer: 59 | Admitting: Family Medicine

## 2015-11-25 DIAGNOSIS — F329 Major depressive disorder, single episode, unspecified: Secondary | ICD-10-CM

## 2015-11-25 MED ORDER — BUPROPION HCL ER (XL) 150 MG PO TB24: 150.0000 mg | ORAL_TABLET | Freq: Every day | ORAL | 2 refills | Status: DC

## 2015-11-25 NOTE — Progress Notes (Signed)
HPI:   Jesse Warner is a 50 y.o. male, who is here today to establish care with me.  Former PCP: N/A Last preventive routine visit: many years ago.  Concerns today: medication refill.  Recently Dx with depression, started on Celexa initially but did not tolerated well, caused insomnia and some ED.  He is currently on Wellbutrin XL 150 mg, started on 10/12/2015. He denies any prior history of anxiety/depression. Event that triggered symptoms, his wife left him in 05/2015 and confesed she was homosexual, now she is living with her partner. 2 children (12 and 48 yo) , going back and fourth.  He denies any suicidal thoughts. No problems with sleep. 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    Review of Systems  Constitutional: Negative for activity change, appetite change, fatigue, fever and unexpected weight change.  HENT: Negative for nosebleeds, sore throat and trouble swallowing.   Eyes: Negative for redness and visual disturbance.  Respiratory: Negative for apnea, cough, shortness of breath and wheezing.   Cardiovascular: Negative for chest pain, palpitations and leg swelling.  Gastrointestinal: Negative for abdominal pain, nausea and vomiting.  Genitourinary: Negative for decreased urine volume, dysuria and hematuria.  Neurological: Negative for dizziness, seizures, weakness, numbness and headaches.  Psychiatric/Behavioral: Negative for confusion, hallucinations and sleep disturbance.      No current outpatient prescriptions on file prior to visit.   No current facility-administered medications on file prior to visit.      Past Medical History:  Diagnosis Date  . Depression   . Gout   . Hepatitis   . Mononucleosis, infectious, with hepatitis   . Pneumonia    No Known Allergies  Family History  Problem Relation Age of Onset  . Cancer Mother     ovarian    Social History   Social History  . Marital  status: Married    Spouse name: N/A  . Number of children: 2  . Years of education: N/A   Occupational History  . 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 Smoking status: Former Smoker    Packs/day: 1.00    Years: 12.00    Types: Cigarettes    Quit date: 02/10/1999  . Smokeless tobacco: Never Used  . Alcohol use Yes     Comment: wkends  . Drug use:     Types: Marijuana  . Sexual activity: Yes   Other Topics Concern  . None   Social History Narrative  . None    Vitals:   11/25/15 1255  BP: 122/80  Pulse: 90  Resp: 12    Body mass index is 33.36 kg/m.   Physical Exam  Constitutional: He is oriented to person, place, and time. He appears well-developed. No distress.  HENT:  Head: Atraumatic.  Mouth/Throat: Oropharynx is clear and moist and mucous membranes are normal.  Eyes: Conjunctivae and EOM are normal. Pupils are equal, round, and reactive to light.  Neck: No thyroid mass and no thyromegaly present.  Cardiovascular: Normal rate and regular rhythm.   No murmur heard. Respiratory: Effort normal and breath sounds normal. No respiratory distress.  GI: Soft. He exhibits no mass. There is no hepatomegaly. There is no tenderness.  Musculoskeletal: He exhibits no edema.  Neurological: He is alert and oriented to person, place, and time. He has normal strength.  Skin: Skin is warm. No erythema.  Psychiatric:  He has a normal mood and affect. His speech is normal. Cognition and memory are normal.  Well groomed, good eye contact.      ASSESSMENT AND PLAN:     Jesse Warner was seen today for establish care.  Diagnoses and all orders for this visit:  Single current episode of major depressive disorder, unspecified depression episode severity -     buPROPion (WELLBUTRIN XL) 150 MG 24 hr tablet; Take 1 tablet (150 mg total) by mouth daily.   Improved, tolerating medication well. Some side effects of Wellbutrin discussed. Instructed about  warning signs. Follow-up in 6 months, before if needed.    -Encouraged to schedule a CPE.    Betty G. [place], MD  [facility]. [place] office.

## 2015-11-25 NOTE — Patient Instructions (Signed)
A few things to remember from today's visit:   Single current episode of major depressive disorder, unspecified depression episode severity - Plan: buPROPion (WELLBUTRIN XL) 150 MG 24 hr tablet  No changes today, consider a complete physical next office visit.  Please be sure medication list is accurate. If a new problem present, please set up appointment sooner than planned today.

## 2016-05-18 ENCOUNTER — Other Ambulatory Visit: Payer: 59

## 2016-05-25 ENCOUNTER — Encounter: Payer: 59 | Admitting: Family Medicine

## 2016-07-01 ENCOUNTER — Ambulatory Visit (HOSPITAL_COMMUNITY)
Admission: EM | Admit: 2016-07-01 | Discharge: 2016-07-01 | Disposition: A | Discharge status: Home / Self Care | Payer: 59 | Attending: Family Medicine | Admitting: Family Medicine

## 2016-07-01 ENCOUNTER — Encounter (HOSPITAL_COMMUNITY): Payer: Self-pay | Admitting: Emergency Medicine

## 2016-07-01 DIAGNOSIS — M1000 Idiopathic gout, unspecified site: Secondary | ICD-10-CM

## 2016-07-01 DIAGNOSIS — M79671 Pain in right foot: Secondary | ICD-10-CM

## 2016-07-01 MED ORDER — COLCHICINE 0.6 MG PO TABS
0.6000 mg | ORAL_TABLET | Freq: Two times a day (BID) | ORAL | 1 refills | Status: DC
Start: 2016-07-01 — End: 2017-01-23

## 2016-07-01 MED ORDER — METHYLPREDNISOLONE 4 MG PO TBPK: ORAL_TABLET | ORAL | 0 refills | Status: DC

## 2016-07-01 NOTE — ED Provider Notes (Signed)
CSN: [id number]     Arrival date & time 07/01/16  0957 History   None    Chief Complaint  Patient presents with  . Foot Pain   (Consider location/radiation/quality/duration/timing/severity/associated sxs/prior Treatment) C/o gout flare/attack right ankle.   The history is provided by the patient.  Foot Pain  This is a new problem. The current episode started yesterday. The problem occurs constantly. The problem has not changed since onset.Nothing aggravates the symptoms.    Past Medical History:  Diagnosis Date  . Depression   . Gout   . Hepatitis   . Mononucleosis, infectious, with hepatitis   . Pneumonia    Past Surgical History:  Procedure Laterality Date  . TONSILLECTOMY     Family History  Problem Relation Age of Onset  . Cancer Mother        ovarian   Social History  Substance Use Topics  . Smoking status: Former Smoker    Packs/day: 1.00    Years: 12.00    Types: Cigarettes    Quit date: 02/10/1999  . Smokeless tobacco: Never Used  . Alcohol use Yes     Comment: wkends    Review of Systems  Constitutional: Negative.   HENT: Negative.   Eyes: Negative.   Respiratory: Negative.   Cardiovascular: Negative.   Gastrointestinal: Negative.   Endocrine: Negative.   Genitourinary: Negative.   Musculoskeletal: Positive for arthralgias.  Allergic/Immunologic: Negative.   Neurological: Negative.   Hematological: Negative.   Psychiatric/Behavioral: Negative.     Allergies  Patient has no known allergies.  Home Medications   Prior to Admission medications   Medication Sig Start Date End Date Taking? Authorizing Provider  colchicine 0.6 MG tablet Take 1 tablet (0.6 mg total) by mouth 2 (two) times daily. 07/01/16   Deatra Canterxford, Ceylon Arenson J, FNP  methylPREDNISolone (MEDROL DOSEPAK) 4 MG TBPK tablet Take 6-5-4-3-2-1 po qd 07/01/16   Deatra Canterxford, Sherrika Weakland J, FNP   Meds Ordered and Administered this Visit  Medications - No data to display  BP 129/72 (BP Location: Right  Arm)   Pulse 91   Temp 98.2 F (36.8 C) (Oral)   Resp 18   SpO2 98%  No data found.   Physical Exam  Constitutional: He appears well-developed and well-nourished.  HENT:  Head: Normocephalic.  Right Ear: External ear normal.  Left Ear: External ear normal.  Mouth/Throat: Oropharynx is clear and moist.  Eyes: Conjunctivae and EOM are normal. Pupils are equal, round, and reactive to light.  Neck: Normal range of motion. Neck supple.  Cardiovascular: Normal rate, regular rhythm and normal heart sounds.   Pulmonary/Chest: Effort normal and breath sounds normal.  Abdominal: Soft. Bowel sounds are normal.  Musculoskeletal: He exhibits tenderness.  TTP right medial malleolus  Nursing note and vitals reviewed.   Urgent Care Course     Procedures (including critical care time)  Labs Review Labs Reviewed - No data to display  Imaging Review No results found.   Visual Acuity Review  Right Eye Distance:   Left Eye Distance:   Bilateral Distance:    Right Eye Near:   Left Eye Near:    Bilateral Near:         MDM   1. Foot pain, right   2. Acute idiopathic gout, unspecified site    Medrol dose pack take 6-5-4-3-2-1 po qd 4mg  #21 Colchicine 1 po bid #30    Deatra CanterOxford, Amyia Lodwick J, FNP 07/01/16 1750

## 2016-07-01 NOTE — ED Triage Notes (Signed)
[facility] a [facility] a gout flare up in his right foot.

## 2017-01-23 ENCOUNTER — Encounter (HOSPITAL_COMMUNITY): Payer: Self-pay | Admitting: Family Medicine

## 2017-01-23 ENCOUNTER — Ambulatory Visit (HOSPITAL_COMMUNITY)
Admission: EM | Admit: 2017-01-23 | Discharge: 2017-01-23 | Disposition: A | Discharge status: Home / Self Care | Payer: 59 | Attending: Family Medicine | Admitting: Family Medicine

## 2017-01-23 DIAGNOSIS — M109 Gout, unspecified: Secondary | ICD-10-CM | POA: Diagnosis not present

## 2017-01-23 MED ORDER — ALLOPURINOL 100 MG PO TABS: 100.0000 mg | ORAL_TABLET | Freq: Every day | ORAL | 1 refills | Status: AC

## 2017-01-23 MED ORDER — COLCHICINE 0.6 MG PO TABS: ORAL_TABLET | ORAL | 0 refills | Status: DC

## 2017-01-23 NOTE — ED Triage Notes (Signed)
Pt here in hopes that he can get a daily preventative medication for gout. Reports that he has been having more frequent flare up than normal. sts that Thursday, Friday having pretty bad pain in left ankle. Today the pain has decreased.

## 2017-01-27 NOTE — ED Provider Notes (Signed)
  [facility]   [id number] 01/23/17 Arrival Time: 1520  ASSESSMENT & PLAN:  1. Acute gout of right ankle, unspecified cause     Meds ordered this encounter  Medications  . colchicine 0.6 MG tablet    Sig: Take 1-2 tabs by mouth. If not improving may repeat after 1 hour.    Dispense:  3 tablet    Refill:  0  . allopurinol (ZYLOPRIM) 100 MG tablet    Sig: Take 1 tablet (100 mg total) by mouth daily.    Dispense:  30 tablet    Refill:  1   Will wait until acute exacerbation resolves before starting allopurinol. Recommend f/u evaluation with PCP. May f/u here as needed.  Reviewed expectations re: course of current medical issues. Questions answered. Outlined signs and symptoms indicating need for more acute intervention. Patient verbalized understanding. After Visit Summary given.   SUBJECTIVE: History from: patient. Jesse Warner is a 51 y.o. male who presents with complaint of persistent R ankle discomfort. Reports gradual onset several days ago. No specific aggravating or alleviating factors reported.Marland Kitchen. Described symptoms have progressed to a point and plateaued and improved, beginning 1 day ago. No OTC treatment. Has used colchicine in the past with good results. Would like to be placed on a preventative medication. Ambulatory without difficulty.  ROS: As per HPI.   OBJECTIVE:  Vitals:   01/23/17 1540  BP: 119/78  Pulse: 95  Resp: 18  Temp: 98.2 F (36.8 C)  SpO2: 100%    General appearance: alert; no distress Extremities: no cyanosis or edema; symmetrical with no gross deformities; mild tenderness over L lateral ankle; no bony tenderness Skin: warm and dry Neurologic: normal gait; normal symmetric reflexes Psychological: alert and cooperative; normal mood and affect   No Known Allergies  Past Medical History:  Diagnosis Date  . Depression   . Gout   . Hepatitis   . Mononucleosis, infectious, with hepatitis   . Pneumonia    Social  History   Socioeconomic History  . Marital status: Legally Separated    Spouse name: Not on file  . Number of children: 2  . Years of education: Not on file  . Highest education level: Not on file  Social Needs  . Financial resource strain: Not on file  . Food insecurity - worry: Not on file  . Food insecurity - inability: Not on file  . Transportation needs - medical: Not on file  . Transportation needs - non-medical: Not on file  Occupational History  . 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 Smoking status: Former Smoker    Packs/day: 1.00    Years: 12.00    Pack years: 12.00    Types: Cigarettes    Last attempt to quit: 02/10/1999    Years since quitting: 17.9  . Smokeless tobacco: Never Used  Substance and Sexual Activity  . Alcohol use: Yes    Comment: wkends  . Drug use: Yes    Types: Marijuana  . Sexual activity: Yes  Other Topics Concern  . Not on file  Social History Narrative  . Not on file   Family History  Problem Relation Age of Onset  . Cancer Mother        ovarian   Past Surgical History:  Procedure Laterality Date  . Greig RightNSILLECTOMY       Kinley Ferrentino, MD 01/27/17 [id number]

## 2017-07-08 ENCOUNTER — Ambulatory Visit (HOSPITAL_COMMUNITY)
Admission: EM | Admit: 2017-07-08 | Discharge: 2017-07-08 | Disposition: A | Discharge status: Home / Self Care | Payer: 59 | Attending: Family Medicine | Admitting: Family Medicine

## 2017-07-08 ENCOUNTER — Encounter (HOSPITAL_COMMUNITY): Payer: Self-pay | Admitting: Emergency Medicine

## 2017-07-08 DIAGNOSIS — L237 Allergic contact dermatitis due to plants, except food: Secondary | ICD-10-CM

## 2017-07-08 MED ORDER — PREDNISONE 10 MG PO TABS: 10.0000 mg | ORAL_TABLET | Freq: Every day | ORAL | 0 refills | Status: DC

## 2017-07-08 NOTE — ED Provider Notes (Signed)
[facility]    CSN: [id number] Arrival date & time: 07/08/17  1031     History   Chief Complaint Chief Complaint  Patient presents with  . Rash    HPI Jesse Warner is a 52 y.o. male.   Patient has contact dermatitis for the past 3 days.  He noted this after working in his yard and exposure to poison ivy.  He has been using calamine and some scrub since then.  HPI  Past Medical History:  Diagnosis Date  . Depression   . Gout   . Hepatitis   . Mononucleosis, infectious, with hepatitis   . Pneumonia     Patient Active Problem List   Diagnosis Date Noted  . 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 Upper respiratory infection 08/25/2013  . Bronchopneumonia 12/08/2012    Past Surgical History:  Procedure Laterality Date  . TONSILLECTOMY         Home Medications    Prior to Admission medications   Medication Sig Start Date End Date Taking? Authorizing Provider  allopurinol (ZYLOPRIM) 100 MG tablet Take 1 tablet (100 mg total) by mouth daily. Patient not taking: Reported on 07/08/2017 01/23/17   Mardella Layman, MD  colchicine 0.6 MG tablet Take 1-2 tabs by mouth. If not improving may repeat after 1 hour. Patient not taking: Reported on 07/08/2017 01/23/17   Mardella Layman, MD    Family History Family History  Problem Relation Age of Onset  . Cancer Mother        ovarian    Social History Social History   Tobacco Use  . Smoking status: Former Smoker    Packs/day: 1.00    Years: 12.00    Pack years: 12.00    Types: Cigarettes    Last attempt to quit: 02/10/1999    Years since quitting: 18.4  . Smokeless tobacco: Never Used  Substance Use Topics  . Alcohol use: Yes    Comment: wkends  . Drug use: Yes    Types: Marijuana     Allergies   Patient has no known allergies.   Review of Systems Review of Systems  Constitutional: Negative for chills and fever.  HENT: Negative for ear pain and sore throat.   Eyes: Negative for  pain and visual disturbance.  Respiratory: Negative for cough and shortness of breath.   Cardiovascular: Negative for chest pain and palpitations.  Gastrointestinal: Negative for abdominal pain and vomiting.  Genitourinary: Negative for dysuria and hematuria.  Musculoskeletal: Negative for arthralgias and back pain.  Skin: Positive for rash. Negative for color change.  Neurological: Negative for seizures and syncope.  All other systems reviewed and are negative.    Physical Exam Triage Vital Signs ED Triage Vitals [07/08/17 1125]  Enc Vitals Group     BP 122/80     Pulse Rate 90     Resp 16     Temp 98.2 F (36.8 C)     Temp Source Oral     SpO2 94 %     Weight 200 lb (90.7 kg)     Height  (1.727 m)     Head Circumference      Peak Flow      Pain Score      Pain Loc      Pain Edu?      Excl. in GC?    No data found.  Updated Vital Signs BP 122/80 (BP Location: Right Arm)   Pulse 90  Temp 98.2 F (36.8 C) (Oral)   Resp 16   Ht  (1.727 m)   Wt 200 lb (90.7 kg)   SpO2 94%   BMI 30.41 kg/m   Visual Acuity Right Eye Distance:   Left Eye Distance:   Bilateral Distance:    Right Eye Near:   Left Eye Near:    Bilateral Near:     Physical Exam  Constitutional: He appears well-developed and well-nourished.  Skin:  Patient has rash consistent with contact dermatitis.  There is some swelling and erythema around the right eye as well     UC Treatments / Results  Labs (all labs ordered are listed, but only abnormal results are displayed) Labs Reviewed - No data to display  EKG None  Radiology No results found.  Procedures Procedures (including critical care time)  Medications Ordered in UC Medications - No data to display  Initial Impression / Assessment and Plan / UC Course  I have reviewed the triage vital signs and the nursing notes.  Pertinent labs & imaging results that were available during my care of the patient were reviewed by me  and considered in my medical decision making (see chart for details).      Final Clinical Impressions(s) / UC Diagnoses  Contact dermatitis secondary to [place] Final diagnoses:  None   Discharge Instructions   None    ED Prescriptions    None     Controlled Substance Prescriptions [place] Controlled Substance Registry consulted? No   Frederica Kuster, MD 07/08/17 1200

## 2017-07-08 NOTE — ED Triage Notes (Signed)
Pt was doing yard work Monday and Tuesday, noticed rash on his face and genital area since Tuesday night.

## 2017-12-08 ENCOUNTER — Other Ambulatory Visit: Payer: Self-pay

## 2017-12-08 ENCOUNTER — Encounter (HOSPITAL_COMMUNITY): Payer: Self-pay | Admitting: Emergency Medicine

## 2017-12-08 ENCOUNTER — Ambulatory Visit (HOSPITAL_COMMUNITY)
Admission: EM | Admit: 2017-12-08 | Discharge: 2017-12-08 | Disposition: A | Discharge status: Home / Self Care | Payer: 59 | Attending: Family Medicine | Admitting: Family Medicine

## 2017-12-08 DIAGNOSIS — Z760 Encounter for issue of repeat prescription: Secondary | ICD-10-CM

## 2017-12-08 DIAGNOSIS — H1011 Acute atopic conjunctivitis, right eye: Secondary | ICD-10-CM

## 2017-12-08 MED ORDER — POLYETHYL GLYCOL-PROPYL GLYCOL 0.4-0.3 % OP GEL: 1.0000 "application " | Freq: Every evening | OPHTHALMIC | 0 refills | Status: AC | PRN

## 2017-12-08 MED ORDER — OLOPATADINE HCL 0.2 % OP SOLN: 1.0000 [drp] | Freq: Every day | OPHTHALMIC | 0 refills | Status: AC

## 2017-12-08 MED ORDER — COLCHICINE 0.6 MG PO TABS: ORAL_TABLET | ORAL | 0 refills | Status: AC

## 2017-12-08 NOTE — Discharge Instructions (Signed)
Use pataday eyedrops as directed on right eye. Artificial tear gel at night. Wait 10-15 minutes between drops, always use artificial tear gel last, as it prevents drops from penetrating through. Lid scrubs and warm compresses as directed. Monitor for any worsening of symptoms, changes in vision, sensitivity to light, eye swelling, painful eye movement, follow up with ophthalmology for further evaluation.   Colchicine refilled for 15 tabs. Follow up with your PCP as needed.

## 2017-12-08 NOTE — ED Triage Notes (Signed)
P has redness, itching and watery drainage from his right eye x2 days.  Pt is also asking if he can get a refill on his gout medication.

## 2017-12-08 NOTE — ED Provider Notes (Signed)
[facility]    CSN: [id number] Arrival date & time: 12/08/17  1400     History   Chief Complaint Chief Complaint  Patient presents with  . Eye Problem    right    HPI Jesse Warner is a 52 y.o. male.   52 year old male comes in for 2 day history of right eye redness, itching, tearing. Denies eye pain, changes in vision, photophobia. Denies contact lens use, does wear glasses. Denies fever, chills, night sweats. Denies URI symptoms such as cough, congestion, sore throat. Has not tried anything for the symptoms.  Patient would also like refill of colchicine. States he is out of the medicine, but not currently having an attack. His PCP retired, so could not get the refill from PCP.      Past Medical History:  Diagnosis Date  . Depression   . Gout   . Hepatitis   . Mononucleosis, infectious, with hepatitis   . Pneumonia     Patient Active Problem List   Diagnosis Date Noted  . 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 Upper respiratory infection 08/25/2013  . Bronchopneumonia 12/08/2012    Past Surgical History:  Procedure Laterality Date  . TONSILLECTOMY         Home Medications    Prior to Admission medications   Medication Sig Start Date End Date Taking? Authorizing Provider  allopurinol (ZYLOPRIM) 100 MG tablet Take 1 tablet (100 mg total) by mouth daily. 01/23/17  Yes Hagler, Arlys John, MD  colchicine 0.6 MG tablet Take 1-2 tabs by mouth. If not improving may repeat after 1 hour. 12/08/17   Cathie Hoops, Ximenna Fonseca V, PA-C  Olopatadine HCl 0.2 % SOLN Apply 1 drop to eye daily. 12/08/17   Cathie Hoops, Juanda Luba V, PA-C  Polyethyl Glycol-Propyl Glycol (SYSTANE) 0.4-0.3 % GEL ophthalmic gel Place 1 application into both eyes at bedtime as needed. 12/08/17   Belinda Fisher, PA-C    Family History Family History  Problem Relation Age of Onset  . Cancer Mother        ovarian    Social History Social History   Tobacco Use  . Smoking status: Former Smoker   Packs/day: 1.00    Years: 12.00    Pack years: 12.00    Types: Cigarettes    Last attempt to quit: 02/10/1999    Years since quitting: 18.8  . Smokeless tobacco: Never Used  Substance Use Topics  . Alcohol use: Yes    Comment: wkends  . Drug use: Yes    Types: Marijuana     Allergies   Patient has no known allergies.   Review of Systems Review of Systems  Reason unable to perform ROS: See HPI as above.     Physical Exam Triage Vital Signs ED Triage Vitals  Enc Vitals Group     BP 12/08/17 1415 120/87     Pulse Rate 12/08/17 1415 90     Resp --      Temp 12/08/17 1415 97.7 F (36.5 C)     Temp Source 12/08/17 1415 Oral     SpO2 12/08/17 1415 99 %     Weight --      Height --      Head Circumference --      Peak Flow --      Pain Score 12/08/17 1416 2     Pain Loc --      Pain Edu? --      Excl. in GC? --  No data found.  Updated Vital Signs BP 120/87 (BP Location: Left Arm)   Pulse 90   Temp 97.7 F (36.5 C) (Oral)   SpO2 99%   Visual Acuity Right Eye Distance:   Left Eye Distance:   Bilateral Distance:    Right Eye Near: R Near: 20/20 with corrective lens Left Eye Near:  L Near: 20/20 with corrective lens Bilateral Near:  20/20 with corrective lens  Physical Exam  Constitutional: He is oriented to person, place, and time. He appears well-developed and well-nourished. No distress.  HENT:  Head: Normocephalic and atraumatic.  Eyes: Pupils are equal, round, and reactive to light. EOM and lids are normal. Lids are everted and swept, no foreign bodies found. Right conjunctiva is injected. Left conjunctiva is not injected.  Neck: Normal range of motion. Neck supple.  Pulmonary/Chest: Effort normal. No accessory muscle usage. No respiratory distress.  Neurological: He is alert and oriented to person, place, and time.  Skin: Skin is warm and dry.     UC Treatments / Results  Labs (all labs ordered are listed, but only abnormal results are  displayed) Labs Reviewed - No data to display  EKG None  Radiology No results found.  Procedures Procedures (including critical care time)  Medications Ordered in UC Medications - No data to display  Initial Impression / Assessment and Plan / UC Course  I have reviewed the triage vital signs and the nursing notes.  Pertinent labs & imaging results that were available during my care of the patient were reviewed by me and considered in my medical decision making (see chart for details).    Start pataday drops as directed. Artificial tears gel as directed. Lid scrubs and warm compresses as directed. Patient to follow up with ophthalmology if symptoms worsens or does not improve. Return precautions given.   Colchicine refilled x 15 tabs. Patient to follow up with PCP for further evaluation and refills needed.   Final Clinical Impressions(s) / UC Diagnoses   Final diagnoses:  Allergic conjunctivitis of right eye  Medication refill    ED Prescriptions    Medication Sig Dispense Auth. Provider   colchicine 0.6 MG tablet Take 1-2 tabs by mouth. If not improving may repeat after 1 hour. 15 tablet Margareth Kanner V, PA-C   Olopatadine HCl 0.2 % SOLN Apply 1 drop to eye daily. 2.5 mL Teryn Boerema V, PA-C   Polyethyl Glycol-Propyl Glycol (SYSTANE) 0.4-0.3 % GEL ophthalmic gel Place 1 application into both eyes at bedtime as needed. 1 Bottle Threasa Alpha, PA-C 12/08/17 1521

## 2019-02-12 ENCOUNTER — Other Ambulatory Visit: Payer: Self-pay

## 2019-02-12 ENCOUNTER — Encounter (HOSPITAL_COMMUNITY): Payer: Self-pay | Admitting: Emergency Medicine

## 2019-02-12 ENCOUNTER — Ambulatory Visit (HOSPITAL_COMMUNITY)
Admission: EM | Admit: 2019-02-12 | Discharge: 2019-02-12 | Disposition: A | Discharge status: Home / Self Care | Payer: 59 | Attending: Family Medicine | Admitting: Family Medicine

## 2019-02-12 DIAGNOSIS — Z79899 Other long term (current) drug therapy: Secondary | ICD-10-CM | POA: Insufficient documentation

## 2019-02-12 DIAGNOSIS — M109 Gout, unspecified: Secondary | ICD-10-CM | POA: Diagnosis not present

## 2019-02-12 DIAGNOSIS — J40 Bronchitis, not specified as acute or chronic: Secondary | ICD-10-CM

## 2019-02-12 DIAGNOSIS — Z87891 Personal history of nicotine dependence: Secondary | ICD-10-CM | POA: Insufficient documentation

## 2019-02-12 DIAGNOSIS — J209 Acute bronchitis, unspecified: Secondary | ICD-10-CM | POA: Insufficient documentation

## 2019-02-12 DIAGNOSIS — Z20822 Contact with and (suspected) exposure to covid-19: Secondary | ICD-10-CM | POA: Insufficient documentation

## 2019-02-12 DIAGNOSIS — Z8041 Family history of malignant neoplasm of ovary: Secondary | ICD-10-CM | POA: Diagnosis not present

## 2019-02-12 DIAGNOSIS — R062 Wheezing: Secondary | ICD-10-CM | POA: Insufficient documentation

## 2019-02-12 MED ORDER — BENZONATATE 100 MG PO CAPS: 100.0000 mg | ORAL_CAPSULE | Freq: Three times a day (TID) | ORAL | 0 refills | Status: AC | PRN

## 2019-02-12 MED ORDER — PREDNISONE 20 MG PO TABS: 40.0000 mg | ORAL_TABLET | Freq: Every day | ORAL | 0 refills | Status: AC

## 2019-02-12 MED ORDER — AMOXICILLIN-POT CLAVULANATE 875-125 MG PO TABS: 1.0000 | ORAL_TABLET | Freq: Two times a day (BID) | ORAL | 0 refills | Status: AC

## 2019-02-12 NOTE — ED Provider Notes (Signed)
[place]    CSN: [id number] Arrival date & time: 02/12/19  1120      History   Chief Complaint Chief Complaint  Patient presents with  . URI    HPI Jesse Warner is a 54 y.o. male.   HPI  Acute Bronchitis: Patient presents for presents evaluation of productive cough with sputum described as thicken-white color. Occasional wheezing. Symptoms began 1 month ago around the time he purchased a new puppy.  He has no known history allergies to dogs or pets. Initially started with a upper chest wall tightness and mild cough. Has progressively worsened since that time. No known history of asthma or COPD. He is a former smoker. Concern as shortness of breath with activity has progressed. He is now wheezing and cough is more persistent.  Past Medical History:  Diagnosis Date  . Depression   . Gout   . Hepatitis   . Mononucleosis, infectious, with hepatitis   . Pneumonia     Patient Active Problem List   Diagnosis Date Noted  . 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 Upper respiratory infection 08/25/2013  . Bronchopneumonia 12/08/2012    Past Surgical History:  Procedure Laterality Date  . TONSILLECTOMY       Home Medications    Prior to Admission medications   Medication Sig Start Date End Date Taking? Authorizing Provider  allopurinol (ZYLOPRIM) 100 MG tablet Take 1 tablet (100 mg total) by mouth daily. 01/23/17   Vanessa Kick, MD  colchicine 0.6 MG tablet Take 1-2 tabs by mouth. If not improving may repeat after 1 hour. 12/08/17   Tasia Catchings, Amy V, PA-C  Olopatadine HCl 0.2 % SOLN Apply 1 drop to eye daily. 12/08/17   Tasia Catchings, Amy V, PA-C  Polyethyl Glycol-Propyl Glycol (SYSTANE) 0.4-0.3 % GEL ophthalmic gel Place 1 application into both eyes at bedtime as needed. 12/08/17   Ok Edwards, PA-C    Family History Family History  Problem Relation Age of Onset  . Cancer Mother        ovarian    Social History Social History   Tobacco Use  . Smoking  status: Former Smoker    Packs/day: 1.00    Years: 12.00    Pack years: 12.00    Types: Cigarettes    Quit date: 02/10/1999    Years since quitting: 20.0  . Smokeless tobacco: Never Used  Substance Use Topics  . Alcohol use: Yes    Comment: wkends  . Drug use: Yes    Types: Marijuana     Allergies   Patient has no known allergies.   Review of Systems Review of Systems Pertinent negatives listed in HPI Physical Exam Triage Vital Signs ED Triage Vitals  Enc Vitals Group     BP 02/12/19 1415 136/87     Pulse Rate 02/12/19 1415 88     Resp 02/12/19 1415 18     Temp 02/12/19 1415 98.4 F (36.9 C)     Temp Source 02/12/19 1415 Oral     SpO2 02/12/19 1415 97 %     Weight --      Height --      Head Circumference --      Peak Flow --      Pain Score 02/12/19 1412 0     Pain Loc --      Pain Edu? --      Excl. in [place]? --    No data found.  Updated Vital Signs  BP 136/87 (BP Location: Left Arm)   Pulse 88   Temp 98.4 F (36.9 C) (Oral)   Resp 18   SpO2 97%   Visual Acuity Right Eye Distance:   Left Eye Distance:   Bilateral Distance:    Right Eye Near:   Left Eye Near:    Bilateral Near:     Physical Exam Constitutional:      Appearance: He is ill-appearing.  HENT:     Head: Normocephalic.     Nose: Congestion present.  Eyes:     Extraocular Movements: Extraocular movements intact.     Pupils: Pupils are equal, round, and reactive to light.  Cardiovascular:     Rate and Rhythm: Normal rate and regular rhythm.  Pulmonary:     Breath sounds: Wheezing and rhonchi present.  Chest:     Chest wall: Tenderness present.  Lymphadenopathy:     Cervical: No cervical adenopathy.  Skin:    General: Skin is warm.  Neurological:     General: No focal deficit present.     Mental Status: He is alert.  Psychiatric:        Mood and Affect: Mood normal.      UC Treatments / Results  Labs (all labs ordered are listed, but only abnormal results are displayed)  Labs Reviewed - No data to display  EKG   Radiology No results found.  Procedures Procedures (including critical care time)  Medications Ordered in UC Medications - No data to display  Initial Impression / Assessment and Plan / UC Course  I have reviewed the triage vital signs and the nursing notes.  Pertinent labs & imaging results that were available during my care of the patient were reviewed by me and considered in my medical decision making (see chart for details).   Treat empirically for acute bronchitis.  COVID-19 testing pending however given onset of symptoms back in early November and current physical exam findings, this is more consistent with an acute bronchitis versus COVID-19 infection.  Will treat empirically with Augmentin twice daily x10 days, prednisone 40 mg once daily x5 days, and benzonatate 100 to 200 mg 3 times daily as needed for cough. Patient has a nebulizer machine at home with albuterol solution. Encouraged to use for wheezing as needed every 6 hours PRN.  Declined albuterol inhaler given he has been machine at home.  Patient advised if symptoms do not significantly improve within the next 3 to 4 days please return for further evaluation and a chest x-ray.  Patient verbalized understanding and agreement with plan. Final Clinical Impressions(s) / UC Diagnoses   Final diagnoses:  Bronchitis  Wheezing   Discharge Instructions   None    ED Prescriptions    Medication Sig Dispense Auth. Provider   amoxicillin-clavulanate (AUGMENTIN) 875-125 MG tablet Take 1 tablet by mouth 2 (two) times daily. 20 tablet Bing Neighbors, FNP   predniSONE (DELTASONE) 20 MG tablet Take 2 tablets (40 mg total) by mouth daily with breakfast. 10 tablet Bing Neighbors, FNP   benzonatate (TESSALON) 100 MG capsule Take 1-2 capsules (100-200 mg total) by mouth 3 (three) times daily as needed for cough. 60 capsule Bing Neighbors, FNP     PDMP not reviewed this encounter.    Bing Neighbors, FNP 02/14/19 2025

## 2019-02-12 NOTE — ED Triage Notes (Signed)
Patient thinks he has bronchitis.   Patient has had symptoms for a month.  Patient says symptoms have not improved or worsened during the month.    Chest congestion Runny nose Cough.

## 2019-02-13 LAB — NOVEL CORONAVIRUS, NAA (HOSP ORDER, SEND-OUT TO REF LAB; TAT 18-24 HRS): SARS-CoV-2, NAA: NOT DETECTED

## 2022-12-04 DIAGNOSIS — M109 Gout, unspecified: Secondary | ICD-10-CM | POA: Diagnosis not present

## 2022-12-04 DIAGNOSIS — E782 Mixed hyperlipidemia: Secondary | ICD-10-CM | POA: Diagnosis not present

## 2022-12-04 DIAGNOSIS — R21 Rash and other nonspecific skin eruption: Secondary | ICD-10-CM | POA: Diagnosis not present

## 2022-12-04 DIAGNOSIS — Z1331 Encounter for screening for depression: Secondary | ICD-10-CM | POA: Diagnosis not present

## 2023-11-05 DIAGNOSIS — Z125 Encounter for screening for malignant neoplasm of prostate: Secondary | ICD-10-CM | POA: Diagnosis not present

## 2023-11-05 DIAGNOSIS — M1A09X0 Idiopathic chronic gout, multiple sites, without tophus (tophi): Secondary | ICD-10-CM | POA: Diagnosis not present

## 2023-11-05 DIAGNOSIS — Z0000 Encounter for general adult medical examination without abnormal findings: Secondary | ICD-10-CM | POA: Diagnosis not present

## 2023-11-05 DIAGNOSIS — E782 Mixed hyperlipidemia: Secondary | ICD-10-CM | POA: Diagnosis not present

## 2023-11-05 DIAGNOSIS — E66812 Obesity, class 2: Secondary | ICD-10-CM | POA: Diagnosis not present

## 2023-11-25 DIAGNOSIS — R0683 Snoring: Secondary | ICD-10-CM | POA: Diagnosis not present

## 2023-11-25 DIAGNOSIS — Z713 Dietary counseling and surveillance: Secondary | ICD-10-CM | POA: Diagnosis not present

## 2023-11-25 DIAGNOSIS — Z7182 Exercise counseling: Secondary | ICD-10-CM | POA: Diagnosis not present

## 2023-11-25 DIAGNOSIS — E66812 Obesity, class 2: Secondary | ICD-10-CM | POA: Diagnosis not present

## 2023-11-25 DIAGNOSIS — Z7689 Persons encountering health services in other specified circumstances: Secondary | ICD-10-CM | POA: Diagnosis not present

## 2023-11-25 DIAGNOSIS — Z1331 Encounter for screening for depression: Secondary | ICD-10-CM | POA: Diagnosis not present

## 2023-12-28 DIAGNOSIS — R632 Polyphagia: Secondary | ICD-10-CM | POA: Diagnosis not present

## 2023-12-28 DIAGNOSIS — Z6836 Body mass index (BMI) 36.0-36.9, adult: Secondary | ICD-10-CM | POA: Diagnosis not present

## 2023-12-28 DIAGNOSIS — E66812 Obesity, class 2: Secondary | ICD-10-CM | POA: Diagnosis not present

## 2023-12-28 DIAGNOSIS — Z713 Dietary counseling and surveillance: Secondary | ICD-10-CM | POA: Diagnosis not present

## 2024-01-27 DIAGNOSIS — E66812 Obesity, class 2: Secondary | ICD-10-CM | POA: Diagnosis not present

## 2024-01-27 DIAGNOSIS — R632 Polyphagia: Secondary | ICD-10-CM | POA: Diagnosis not present

## 2024-01-27 DIAGNOSIS — Z713 Dietary counseling and surveillance: Secondary | ICD-10-CM | POA: Diagnosis not present

## 2024-01-27 DIAGNOSIS — Z6835 Body mass index (BMI) 35.0-35.9, adult: Secondary | ICD-10-CM | POA: Diagnosis not present
